# Patient Record
Sex: Male | Born: 1945 | Race: Black or African American | Hispanic: No | Marital: Married | Smoking: Former smoker
Health system: Southern US, Community
[De-identification: ages and names within clinical notes are randomized; demographics above are authoritative.]

## PROBLEM LIST (undated history)

## (undated) DIAGNOSIS — I10 Essential (primary) hypertension: Secondary | ICD-10-CM

## (undated) DIAGNOSIS — M21379 Foot drop, unspecified foot: Secondary | ICD-10-CM

## (undated) DIAGNOSIS — E559 Vitamin D deficiency, unspecified: Secondary | ICD-10-CM

## (undated) DIAGNOSIS — I69993 Ataxia following unspecified cerebrovascular disease: Secondary | ICD-10-CM

## (undated) DIAGNOSIS — I739 Peripheral vascular disease, unspecified: Secondary | ICD-10-CM

## (undated) DIAGNOSIS — M1710 Unilateral primary osteoarthritis, unspecified knee: Secondary | ICD-10-CM

## (undated) DIAGNOSIS — I2510 Atherosclerotic heart disease of native coronary artery without angina pectoris: Secondary | ICD-10-CM

## (undated) DIAGNOSIS — I639 Cerebral infarction, unspecified: Secondary | ICD-10-CM

## (undated) DIAGNOSIS — N400 Enlarged prostate without lower urinary tract symptoms: Principal | ICD-10-CM

## (undated) DIAGNOSIS — I69920 Aphasia following unspecified cerebrovascular disease: Secondary | ICD-10-CM

## (undated) HISTORY — DX: Benign prostatic hyperplasia without lower urinary tract symptoms: N40.0

## (undated) HISTORY — DX: Vitamin D deficiency, unspecified: E55.9

## (undated) HISTORY — DX: Aphasia following unspecified cerebrovascular disease: I69.920

## (undated) HISTORY — DX: Foot drop, unspecified foot: M21.379

## (undated) HISTORY — PX: CORONARY ARTERY BYPASS GRAFT: SHX141

## (undated) HISTORY — DX: Essential (primary) hypertension: I10

## (undated) HISTORY — DX: Ataxia following unspecified cerebrovascular disease: I69.993

## (undated) HISTORY — DX: Atherosclerotic heart disease of native coronary artery without angina pectoris: I25.10

## (undated) HISTORY — DX: Unilateral primary osteoarthritis, unspecified knee: M17.10

## (undated) HISTORY — DX: Cerebral infarction, unspecified: I63.9

## (undated) HISTORY — DX: Peripheral vascular disease, unspecified: I73.9

---

## 1997-10-24 ENCOUNTER — Ambulatory Visit (HOSPITAL_COMMUNITY): Admission: RE | Admit: 1997-10-24 | Discharge: 1997-10-24 | Payer: Self-pay | Admitting: Cardiology

## 1998-01-12 ENCOUNTER — Ambulatory Visit (HOSPITAL_COMMUNITY): Admission: RE | Admit: 1998-01-12 | Discharge: 1998-01-12 | Payer: Self-pay | Admitting: Cardiology

## 1998-01-19 ENCOUNTER — Ambulatory Visit (HOSPITAL_COMMUNITY): Admission: RE | Admit: 1998-01-19 | Discharge: 1998-01-19 | Payer: Self-pay | Admitting: Cardiology

## 1998-02-03 ENCOUNTER — Ambulatory Visit (HOSPITAL_COMMUNITY): Admission: RE | Admit: 1998-02-03 | Discharge: 1998-02-03 | Payer: Self-pay | Admitting: Cardiology

## 1998-09-29 ENCOUNTER — Ambulatory Visit (HOSPITAL_COMMUNITY): Admission: RE | Admit: 1998-09-29 | Discharge: 1998-09-29 | Payer: Self-pay | Admitting: Cardiology

## 1998-10-13 ENCOUNTER — Encounter: Payer: Self-pay | Admitting: Thoracic Surgery (Cardiothoracic Vascular Surgery)

## 1998-10-17 ENCOUNTER — Inpatient Hospital Stay (HOSPITAL_COMMUNITY)
Admission: RE | Admit: 1998-10-17 | Discharge: 1998-10-25 | Payer: Self-pay | Admitting: Thoracic Surgery (Cardiothoracic Vascular Surgery)

## 1998-10-17 ENCOUNTER — Encounter: Payer: Self-pay | Admitting: Thoracic Surgery (Cardiothoracic Vascular Surgery)

## 1998-10-18 ENCOUNTER — Encounter: Payer: Self-pay | Admitting: Thoracic Surgery (Cardiothoracic Vascular Surgery)

## 1998-10-19 ENCOUNTER — Encounter: Payer: Self-pay | Admitting: Thoracic Surgery (Cardiothoracic Vascular Surgery)

## 1998-10-20 ENCOUNTER — Encounter: Payer: Self-pay | Admitting: Thoracic Surgery (Cardiothoracic Vascular Surgery)

## 1998-10-24 ENCOUNTER — Encounter: Payer: Self-pay | Admitting: Surgery

## 1998-10-29 ENCOUNTER — Encounter: Payer: Self-pay | Admitting: Emergency Medicine

## 1998-10-30 ENCOUNTER — Inpatient Hospital Stay (HOSPITAL_COMMUNITY): Admission: EM | Admit: 1998-10-30 | Discharge: 1998-11-09 | Payer: Self-pay | Admitting: Emergency Medicine

## 1998-10-30 ENCOUNTER — Encounter: Payer: Self-pay | Admitting: Pulmonary Disease

## 1998-11-02 ENCOUNTER — Encounter: Payer: Self-pay | Admitting: Internal Medicine

## 1998-11-09 ENCOUNTER — Inpatient Hospital Stay (HOSPITAL_COMMUNITY)
Admission: RE | Admit: 1998-11-09 | Discharge: 1998-11-21 | Payer: Self-pay | Admitting: Physical Medicine & Rehabilitation

## 1998-11-23 ENCOUNTER — Encounter
Admission: RE | Admit: 1998-11-23 | Discharge: 1999-01-24 | Payer: Self-pay | Admitting: Physical Medicine & Rehabilitation

## 1999-03-27 ENCOUNTER — Encounter: Admission: RE | Admit: 1999-03-27 | Discharge: 1999-06-25 | Payer: Self-pay | Admitting: Cardiology

## 1999-04-30 ENCOUNTER — Encounter: Payer: Self-pay | Admitting: Internal Medicine

## 1999-04-30 ENCOUNTER — Inpatient Hospital Stay (HOSPITAL_COMMUNITY): Admission: AD | Admit: 1999-04-30 | Discharge: 1999-05-04 | Payer: Self-pay | Admitting: Internal Medicine

## 1999-05-01 ENCOUNTER — Encounter: Payer: Self-pay | Admitting: Internal Medicine

## 1999-05-01 ENCOUNTER — Encounter: Payer: Self-pay | Admitting: Neurology

## 1999-06-13 ENCOUNTER — Inpatient Hospital Stay (HOSPITAL_COMMUNITY): Admission: RE | Admit: 1999-06-13 | Discharge: 1999-06-15 | Payer: Self-pay | Admitting: Interventional Radiology

## 1999-07-19 ENCOUNTER — Ambulatory Visit (HOSPITAL_COMMUNITY): Admission: RE | Admit: 1999-07-19 | Discharge: 1999-07-19 | Payer: Self-pay | Admitting: Interventional Radiology

## 1999-08-15 ENCOUNTER — Ambulatory Visit (HOSPITAL_COMMUNITY): Admission: RE | Admit: 1999-08-15 | Discharge: 1999-08-15 | Payer: Self-pay | Admitting: Interventional Radiology

## 1999-10-23 ENCOUNTER — Encounter: Payer: Self-pay | Admitting: Internal Medicine

## 1999-10-23 ENCOUNTER — Inpatient Hospital Stay (HOSPITAL_COMMUNITY): Admission: EM | Admit: 1999-10-23 | Discharge: 1999-10-25 | Payer: Self-pay | Admitting: Emergency Medicine

## 1999-12-20 ENCOUNTER — Encounter: Payer: Self-pay | Admitting: Cardiology

## 1999-12-20 ENCOUNTER — Ambulatory Visit (HOSPITAL_COMMUNITY): Admission: RE | Admit: 1999-12-20 | Discharge: 1999-12-20 | Payer: Self-pay | Admitting: Cardiology

## 1999-12-26 ENCOUNTER — Encounter: Payer: Self-pay | Admitting: Emergency Medicine

## 1999-12-26 ENCOUNTER — Emergency Department (HOSPITAL_COMMUNITY): Admission: EM | Admit: 1999-12-26 | Discharge: 1999-12-26 | Payer: Self-pay | Admitting: Emergency Medicine

## 2000-01-22 ENCOUNTER — Encounter: Admission: RE | Admit: 2000-01-22 | Discharge: 2000-02-05 | Payer: Self-pay | Admitting: Family Medicine

## 2000-02-04 ENCOUNTER — Inpatient Hospital Stay (HOSPITAL_COMMUNITY): Admission: RE | Admit: 2000-02-04 | Discharge: 2000-02-05 | Payer: Self-pay | Admitting: Cardiology

## 2000-02-04 ENCOUNTER — Encounter: Payer: Self-pay | Admitting: Cardiology

## 2000-02-12 ENCOUNTER — Ambulatory Visit: Admission: RE | Admit: 2000-02-12 | Discharge: 2000-02-12 | Payer: Self-pay | Admitting: Cardiovascular Disease

## 2000-03-24 ENCOUNTER — Ambulatory Visit: Admission: RE | Admit: 2000-03-24 | Discharge: 2000-03-24 | Payer: Self-pay | Admitting: Internal Medicine

## 2000-04-03 ENCOUNTER — Encounter: Payer: Self-pay | Admitting: Internal Medicine

## 2000-04-03 ENCOUNTER — Ambulatory Visit (HOSPITAL_COMMUNITY): Admission: RE | Admit: 2000-04-03 | Discharge: 2000-04-03 | Payer: Self-pay | Admitting: Internal Medicine

## 2001-03-24 ENCOUNTER — Encounter: Payer: Self-pay | Admitting: Internal Medicine

## 2001-03-24 ENCOUNTER — Inpatient Hospital Stay (HOSPITAL_COMMUNITY): Admission: EM | Admit: 2001-03-24 | Discharge: 2001-03-31 | Payer: Self-pay | Admitting: Emergency Medicine

## 2001-03-25 ENCOUNTER — Encounter: Payer: Self-pay | Admitting: Internal Medicine

## 2001-03-26 ENCOUNTER — Encounter: Payer: Self-pay | Admitting: Internal Medicine

## 2001-04-13 ENCOUNTER — Encounter: Admission: RE | Admit: 2001-04-13 | Discharge: 2001-06-10 | Payer: Self-pay | Admitting: Internal Medicine

## 2001-12-17 ENCOUNTER — Ambulatory Visit (HOSPITAL_COMMUNITY): Admission: RE | Admit: 2001-12-17 | Discharge: 2001-12-17 | Payer: Self-pay | Admitting: Interventional Radiology

## 2002-11-26 ENCOUNTER — Emergency Department (HOSPITAL_COMMUNITY): Admission: EM | Admit: 2002-11-26 | Discharge: 2002-11-27 | Payer: Self-pay | Admitting: Emergency Medicine

## 2003-01-12 ENCOUNTER — Ambulatory Visit (HOSPITAL_COMMUNITY): Admission: RE | Admit: 2003-01-12 | Discharge: 2003-01-12 | Payer: Self-pay | Admitting: Interventional Radiology

## 2004-01-18 ENCOUNTER — Ambulatory Visit (HOSPITAL_COMMUNITY): Admission: RE | Admit: 2004-01-18 | Discharge: 2004-01-18 | Payer: Self-pay | Admitting: Interventional Radiology

## 2004-02-16 ENCOUNTER — Inpatient Hospital Stay (HOSPITAL_COMMUNITY): Admission: EM | Admit: 2004-02-16 | Discharge: 2004-02-23 | Payer: Self-pay | Admitting: Emergency Medicine

## 2004-04-10 ENCOUNTER — Ambulatory Visit (HOSPITAL_COMMUNITY): Admission: RE | Admit: 2004-04-10 | Discharge: 2004-04-10 | Payer: Self-pay | Admitting: Neurology

## 2004-05-09 ENCOUNTER — Observation Stay (HOSPITAL_COMMUNITY): Admission: RE | Admit: 2004-05-09 | Discharge: 2004-05-09 | Payer: Self-pay | Admitting: Interventional Radiology

## 2004-08-01 ENCOUNTER — Emergency Department (HOSPITAL_COMMUNITY): Admission: EM | Admit: 2004-08-01 | Discharge: 2004-08-01 | Payer: Self-pay | Admitting: Emergency Medicine

## 2004-11-12 ENCOUNTER — Ambulatory Visit (HOSPITAL_COMMUNITY): Admission: RE | Admit: 2004-11-12 | Discharge: 2004-11-12 | Payer: Self-pay | Admitting: Interventional Radiology

## 2004-12-02 IMAGING — CT CT HEAD W/O CM
1 of 2 series · 13 of 30 positions shown, 17 images · non-contrast
Comparison: none

CLINICAL DATA: Left-sided weakness. 
CT HEAD WITHOUT CONTRAST, 02/15/04
TECHNIQUE: Multidetector helical CT scanning obtained from the skull base to the vertex.

[Series 2: brain · axial · 0.47mm/px · z∈[+146,+274]mm · 13 of 32 slices shown, 17 images]
[im 3/32  brain]
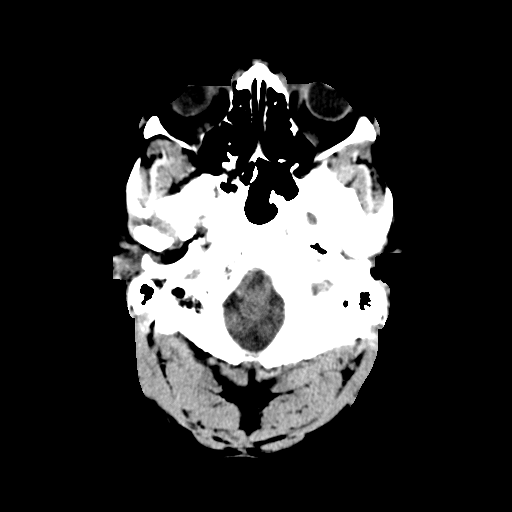
[im 3/32  bone]
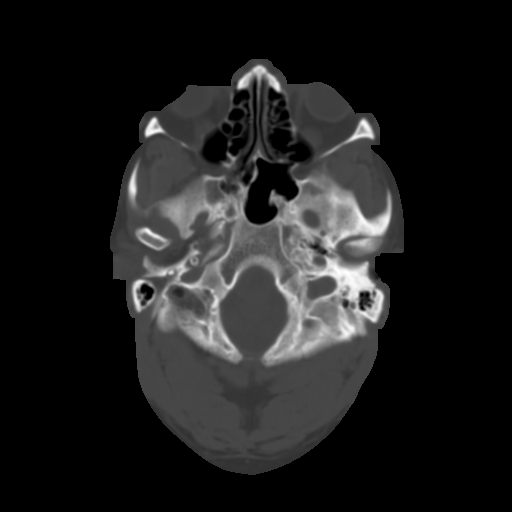
[im 5/32  brain]
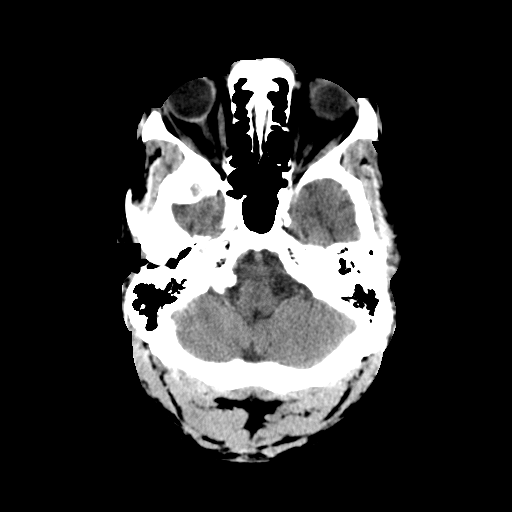
[im 7/32  brain]
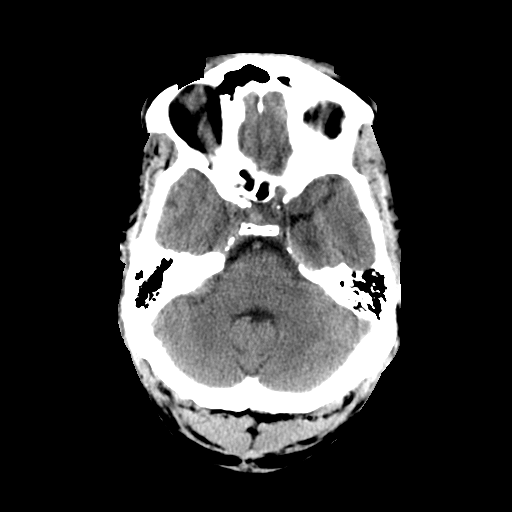
[im 9/32  brain]
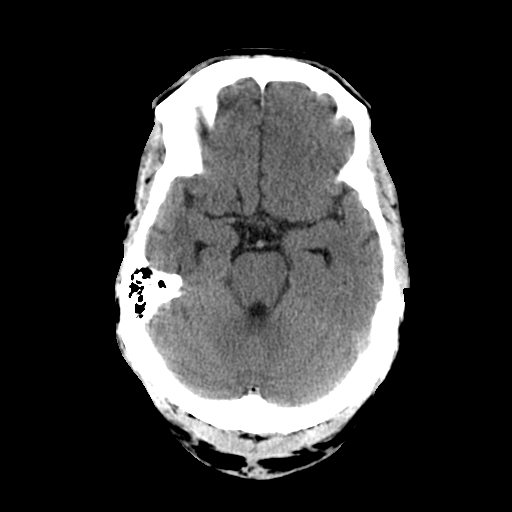
[im 12/32  brain]
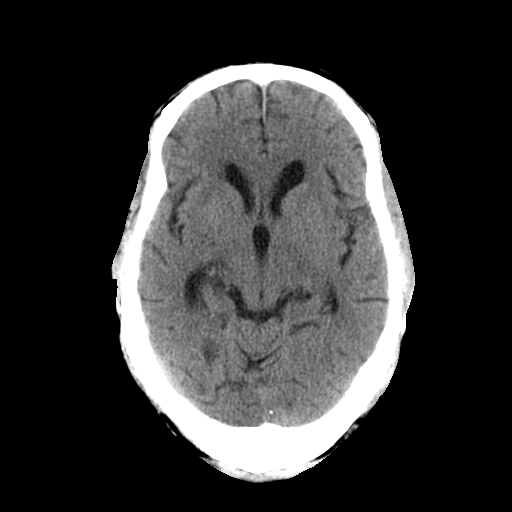
[im 12/32  bone]
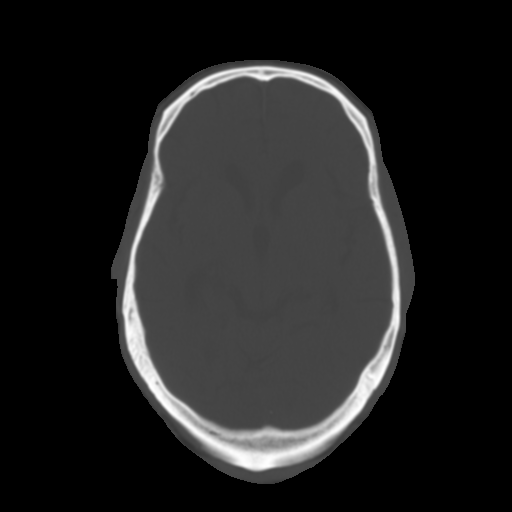
[im 14/32  brain]
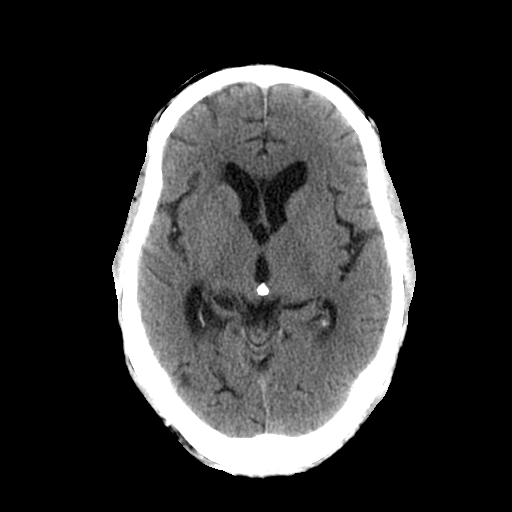
[im 16/32  brain]
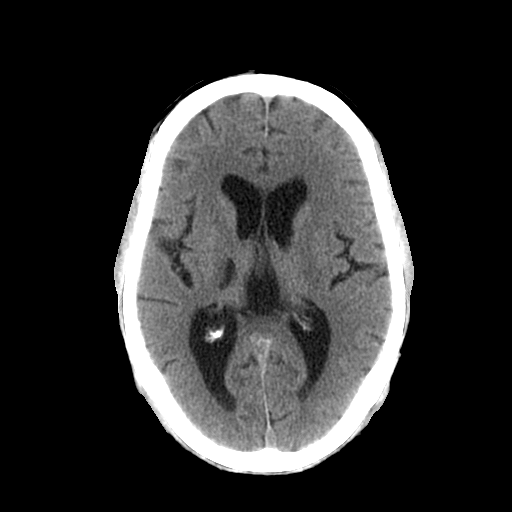
[im 18/32  brain]
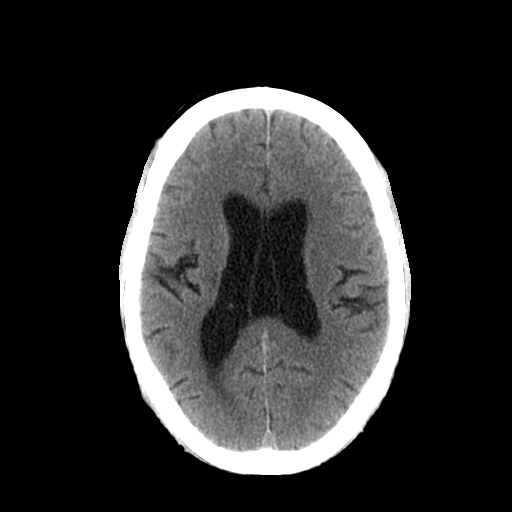
[im 20/32  brain]
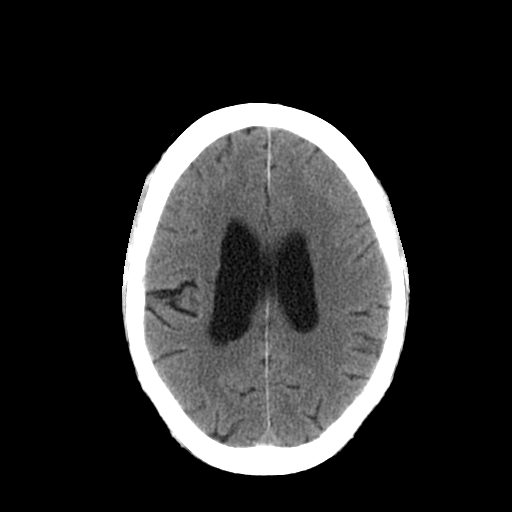
[im 20/32  bone]
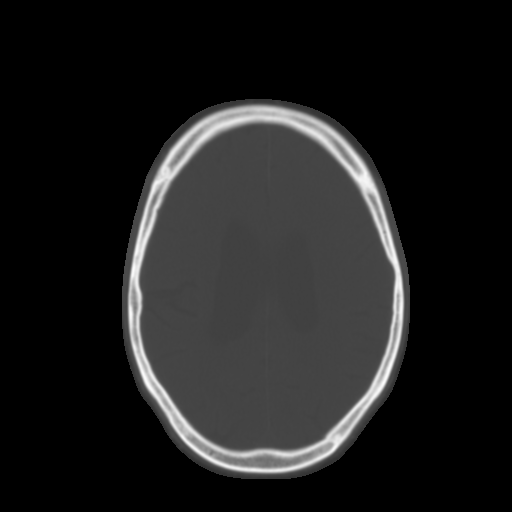
[im 23/32  brain]
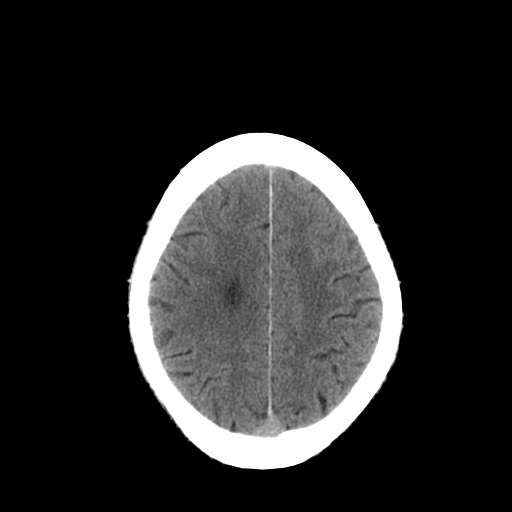
[im 25/32  brain]
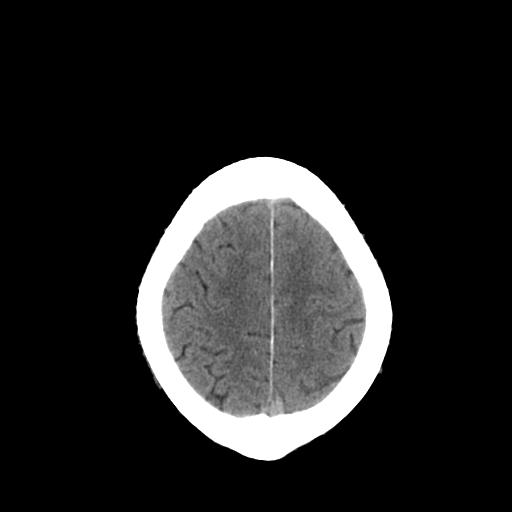
[im 27/32  brain]
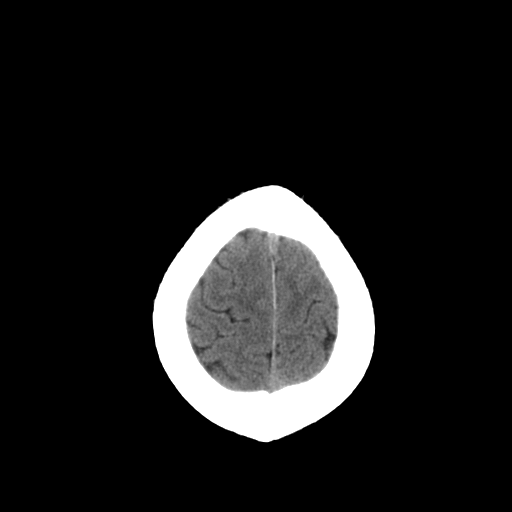
[im 29/32  brain]
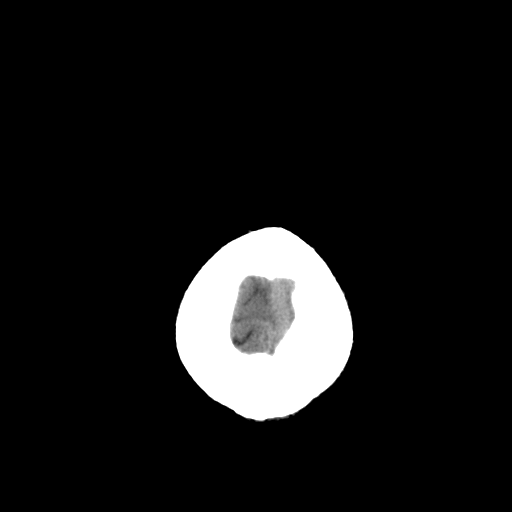
[im 29/32  bone]
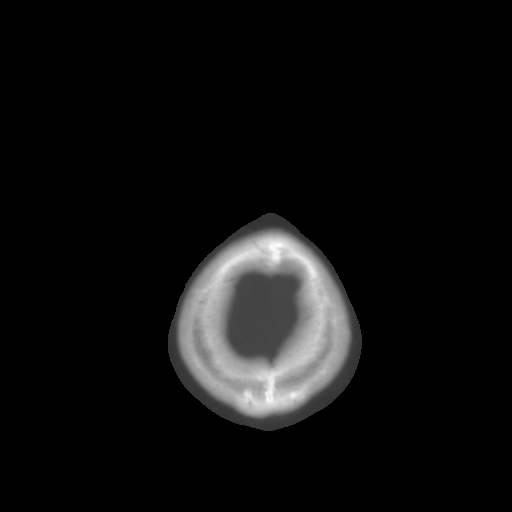

[13 of 30 positions shown; findings below may reference images not displayed]

FINDINGS: Comparison to MRI dated 01/18/04.
Remote infarcts in the thalami bilaterally are noted.  No evidence of acute intracranial abnormality including mass or mass effect, hydrocephalus, extra-axial fluid collection, midline shift, hemorrhage or acute infarct.  Acute infarct may be missed by CT for 24-48 hours.  Visualized bony calvarium and paranasal sinuses are unremarkable.  Cavum septum pellucidum et vergae is again identified.  
IMPRESSION
No evidence of acute intracranial abnormality. 
Remote thalamic infarcts.

## 2005-09-27 ENCOUNTER — Encounter (INDEPENDENT_AMBULATORY_CARE_PROVIDER_SITE_OTHER): Payer: Self-pay | Admitting: Specialist

## 2005-09-27 ENCOUNTER — Ambulatory Visit (HOSPITAL_COMMUNITY): Admission: RE | Admit: 2005-09-27 | Discharge: 2005-09-27 | Payer: Self-pay | Admitting: Urology

## 2005-11-04 ENCOUNTER — Inpatient Hospital Stay (HOSPITAL_COMMUNITY): Admission: EM | Admit: 2005-11-04 | Discharge: 2005-11-11 | Payer: Self-pay | Admitting: Emergency Medicine

## 2005-11-06 ENCOUNTER — Encounter (INDEPENDENT_AMBULATORY_CARE_PROVIDER_SITE_OTHER): Payer: Self-pay | Admitting: *Deleted

## 2006-05-30 ENCOUNTER — Emergency Department (HOSPITAL_COMMUNITY): Admission: EM | Admit: 2006-05-30 | Discharge: 2006-05-30 | Payer: Self-pay | Admitting: Emergency Medicine

## 2006-06-11 ENCOUNTER — Emergency Department (HOSPITAL_COMMUNITY): Admission: EM | Admit: 2006-06-11 | Discharge: 2006-06-11 | Payer: Self-pay | Admitting: Emergency Medicine

## 2006-12-20 ENCOUNTER — Emergency Department (HOSPITAL_COMMUNITY): Admission: EM | Admit: 2006-12-20 | Discharge: 2006-12-20 | Payer: Self-pay | Admitting: *Deleted

## 2007-01-15 ENCOUNTER — Encounter: Payer: Self-pay | Admitting: Interventional Radiology

## 2007-01-23 ENCOUNTER — Ambulatory Visit (HOSPITAL_COMMUNITY): Admission: RE | Admit: 2007-01-23 | Discharge: 2007-01-23 | Payer: Self-pay | Admitting: Interventional Radiology

## 2007-03-17 IMAGING — CR DG ANKLE COMPLETE 3+V*L*
3 series · 3 of 3 positions shown · non-contrast
Comparison: none

CLINICAL DATA: Twisted ankle.  Pain.
LEFT ANKLE ? 3 VIEW:

[view not recorded (1 of 3)]
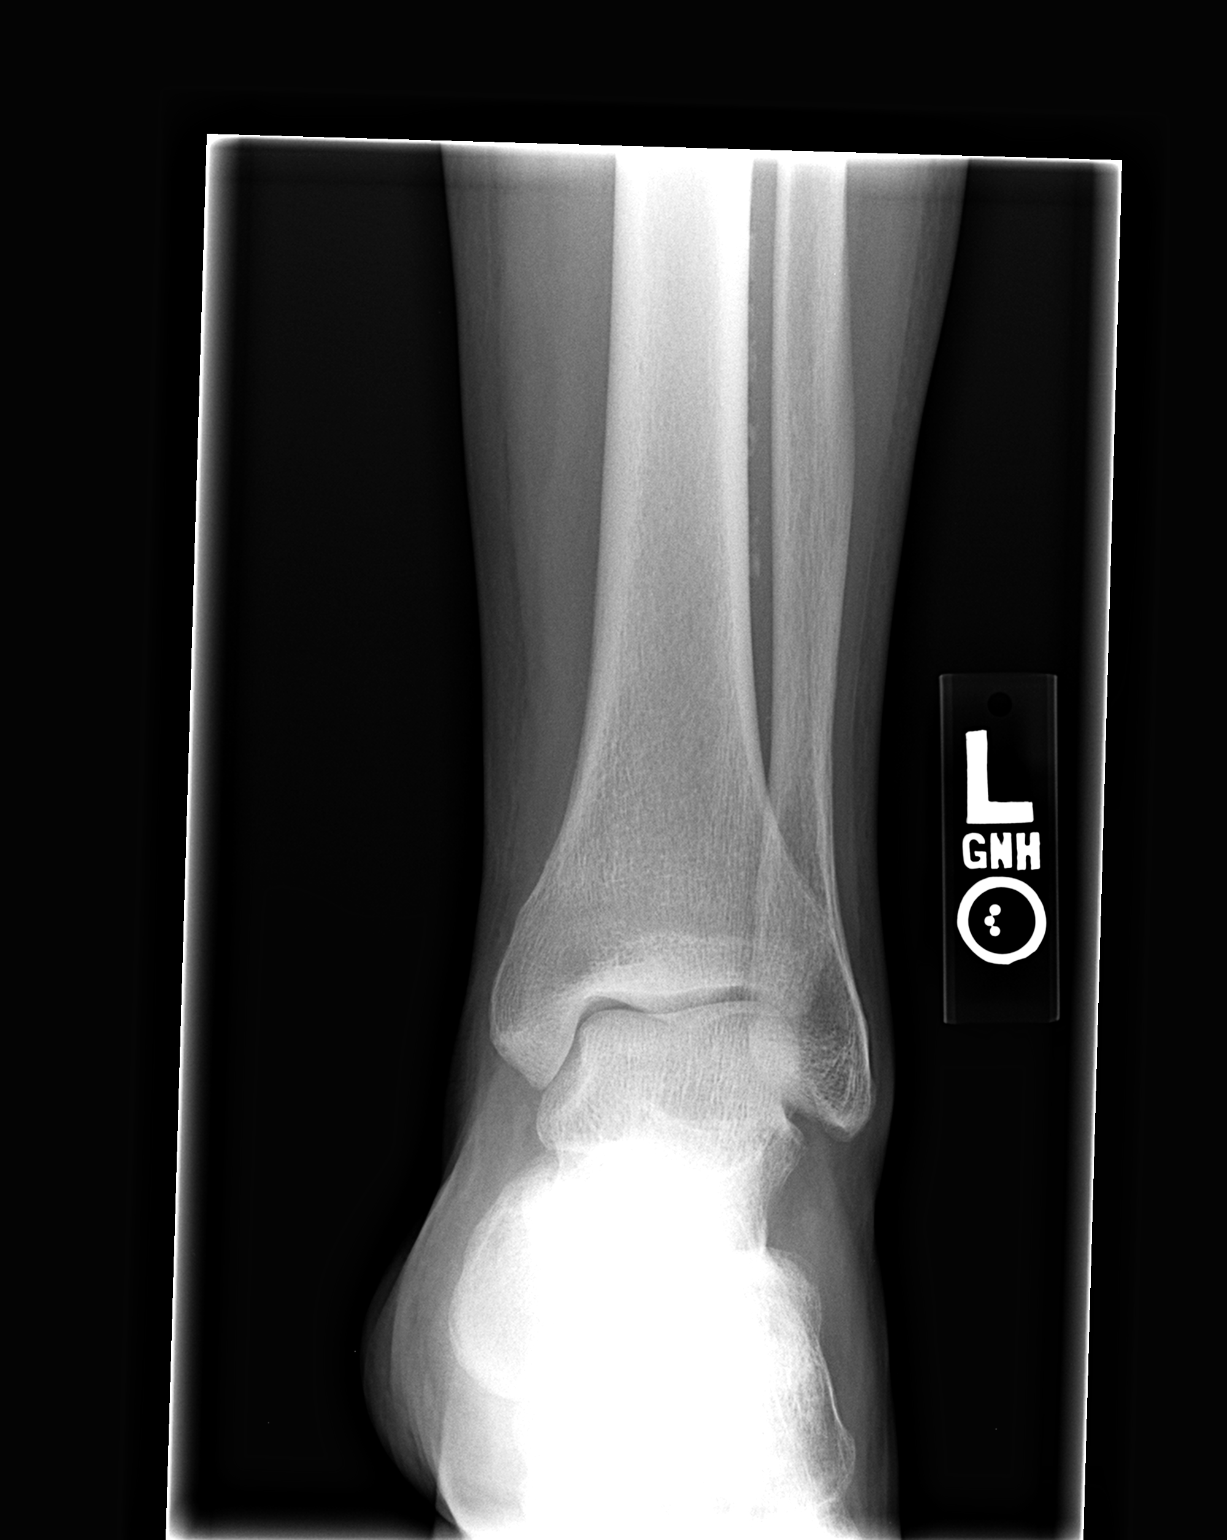

[view not recorded (2 of 3)]
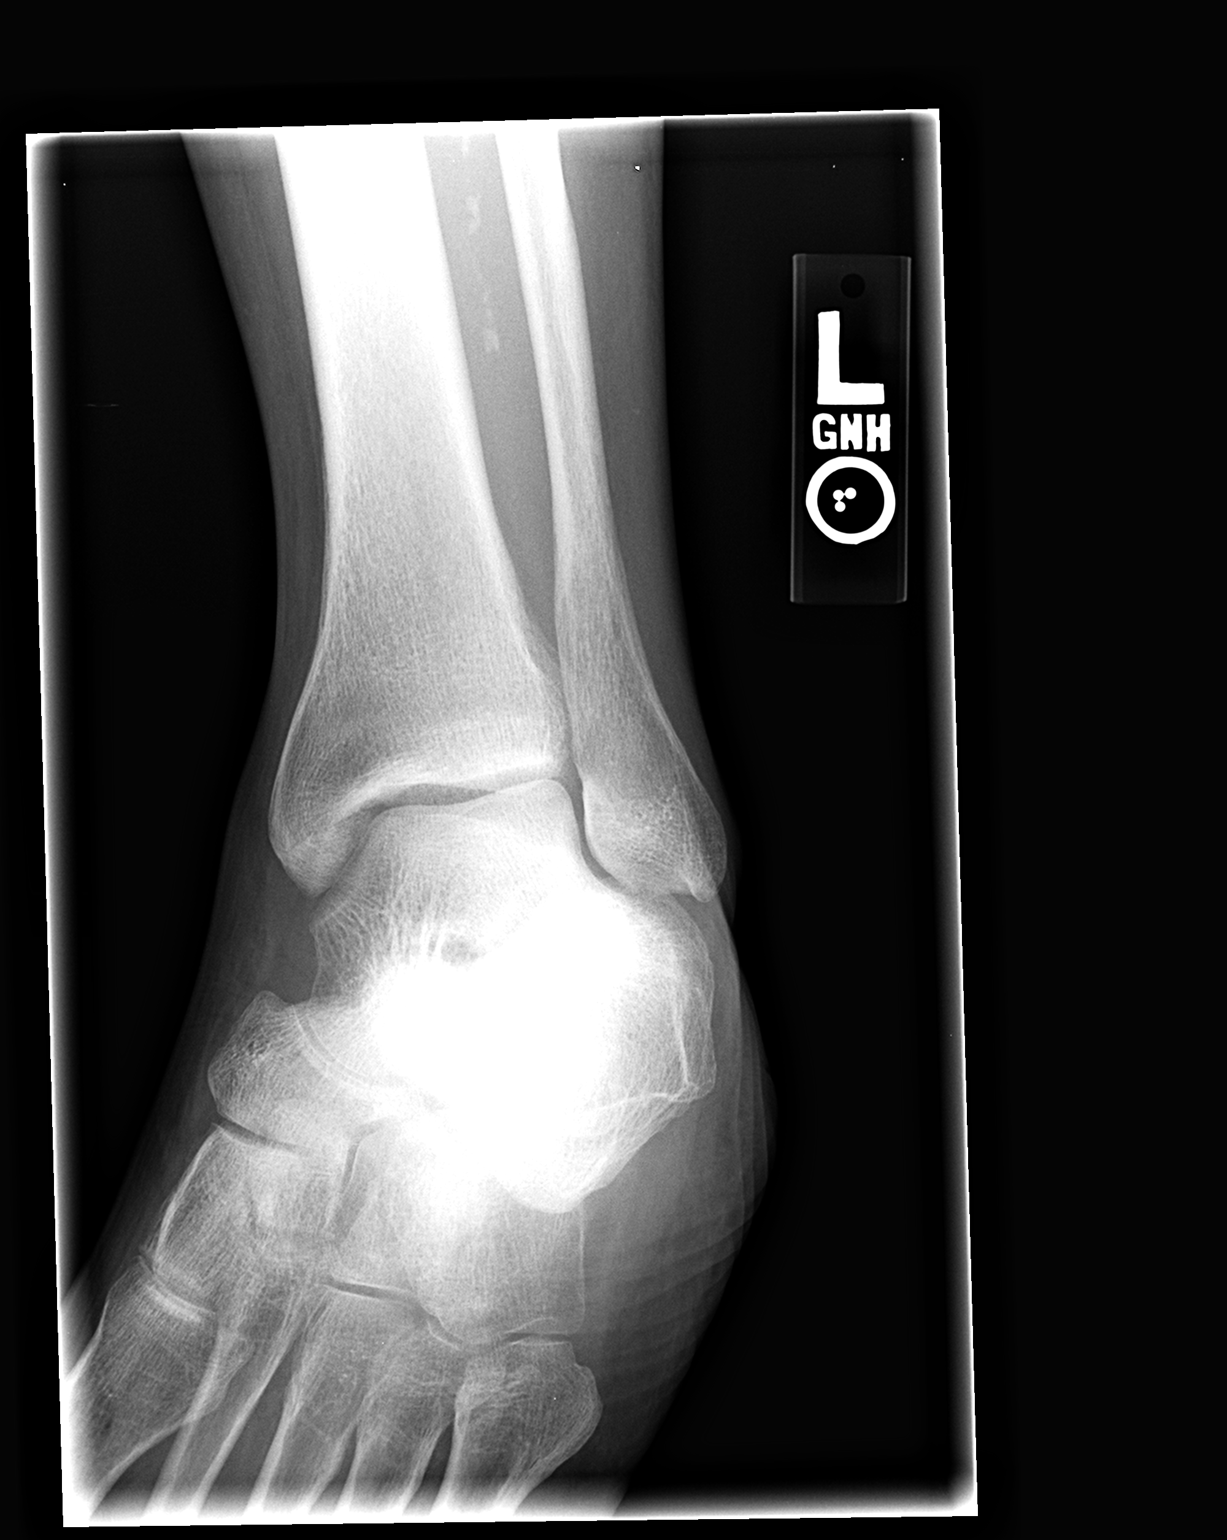

[view not recorded (3 of 3)]
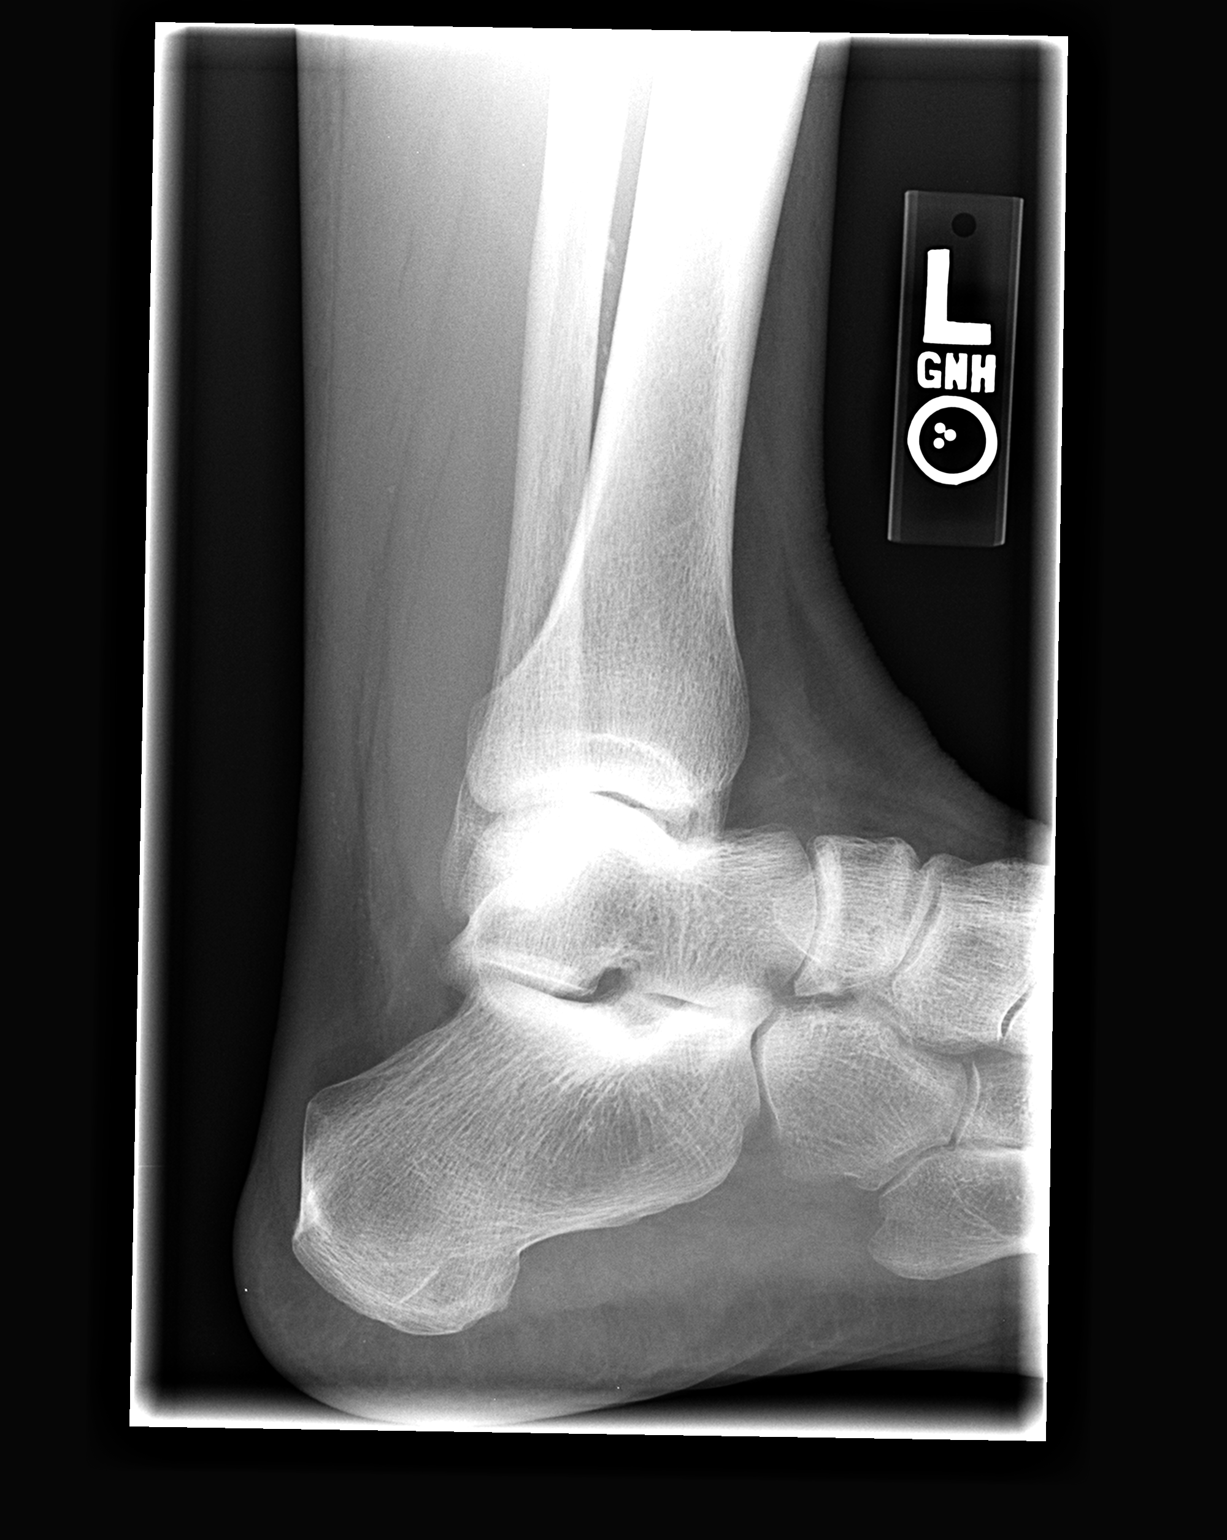

[3 of 3 positions shown; findings below may reference images not displayed]

FINDINGS: Marked soft tissue swelling posteriorly.  No fracture or subluxation.  Calcification of distal trifurcation artery.
IMPRESSION: No acute bony abnormality.

## 2007-05-12 ENCOUNTER — Ambulatory Visit (HOSPITAL_COMMUNITY): Admission: RE | Admit: 2007-05-12 | Discharge: 2007-05-12 | Payer: Self-pay | Admitting: *Deleted

## 2007-05-28 ENCOUNTER — Encounter: Admission: RE | Admit: 2007-05-28 | Discharge: 2007-07-30 | Payer: Self-pay | Admitting: Internal Medicine

## 2007-07-13 ENCOUNTER — Ambulatory Visit (HOSPITAL_COMMUNITY): Admission: RE | Admit: 2007-07-13 | Discharge: 2007-07-13 | Payer: Self-pay | Admitting: Interventional Radiology

## 2007-11-10 IMAGING — XA IR ANGIO/CAROTID/CERV/UNI*R*
1 series · 16 of 24 positions shown · IV contrast (omnipaque)
Comparison: none

CLINICAL DATA: Patient with severe extracranial and intracranial cerebrovascular disease, status post stenting of right ICA at the petrous-cavernous junction.  Patient has new symptoms of left-sided weakness on warfarin.
BILATERAL VERTEBRAL AND RIGHT COMMON CAROTID ARTERIOGRAMS:
Procedure:  Following the full explanation of the procedure, along with the potentially associated complications, an informed witnessed consent was obtained. 
The left groin was prepped and draped in the usual sterile fashion.  Thereafter, using a modified Seldinger technique, transfemoral access into the left common femoral artery was obtained through the graft without difficulty.  Over a 0.035-inch guidewire, a 4-French Pinnacle sheath was inserted.  Through this and also over a 0.035-inch guidewire, a 4-French JB-1 catheter was advanced through the aortic arch region and selectively positioned in the right common carotid artery, in the right vertebral artery, and the left vertebral artery.  There were no acute complications.  The patient tolerated the procedure well. 
Medications utilized:  Versed 1 mg IV, fentanyl 25 mcg IV. 
Contrast:  Omnipaque 300, approximately 50 cc.

[Series 1: run · 16 of 63 slices shown]
[im 1/63]
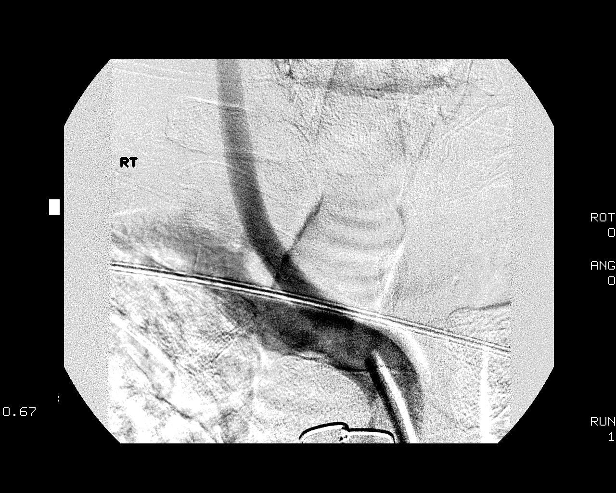
[im 6/63]
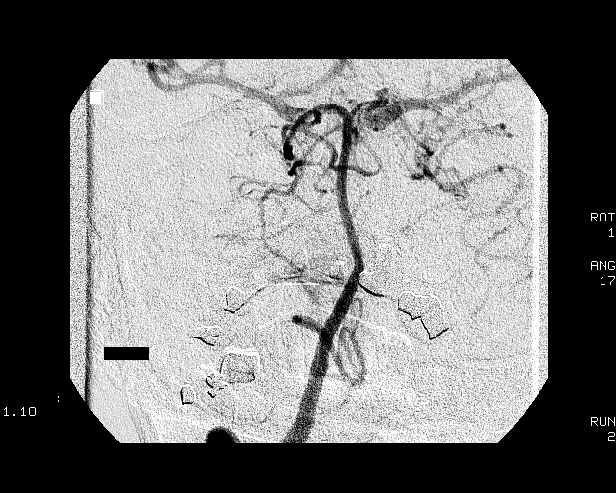
[im 9/63]
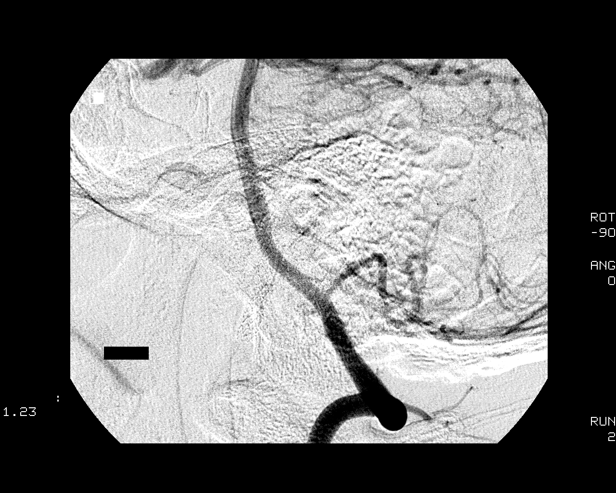
[im 14/63]
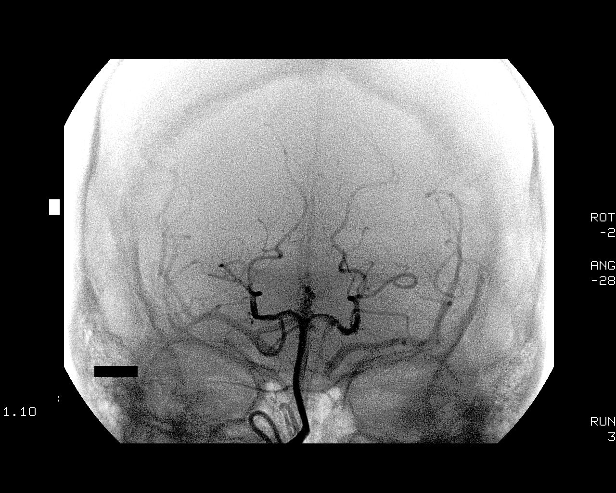
[im 17/63]
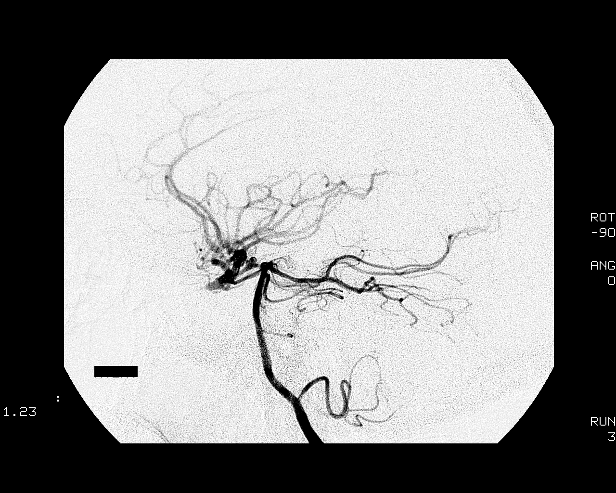
[im 22/63]
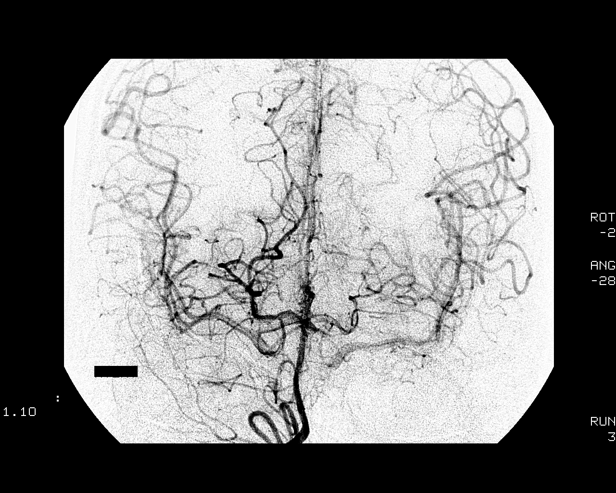
[im 25/63]
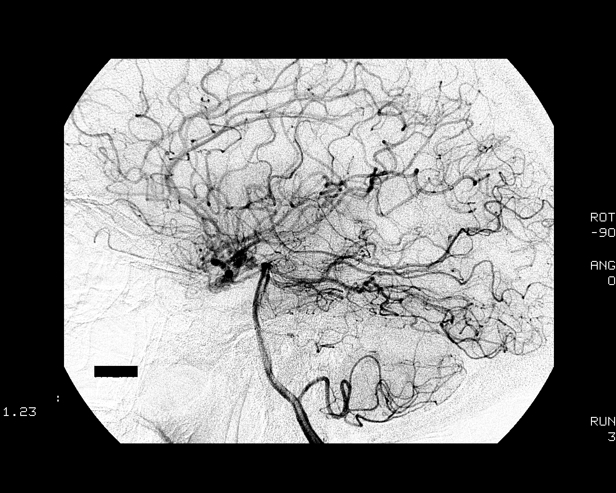
[im 30/63]
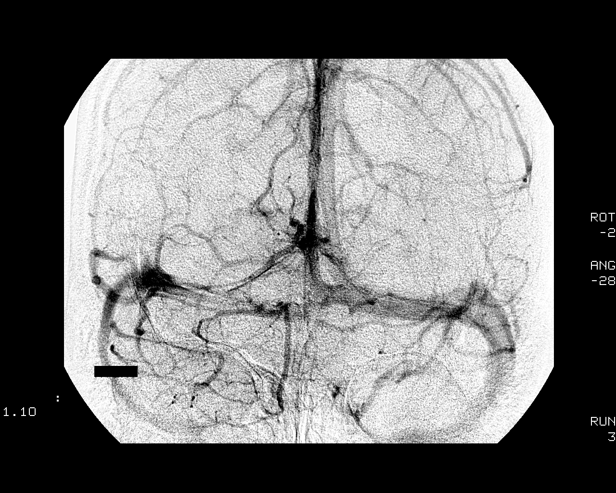
[im 33/63]
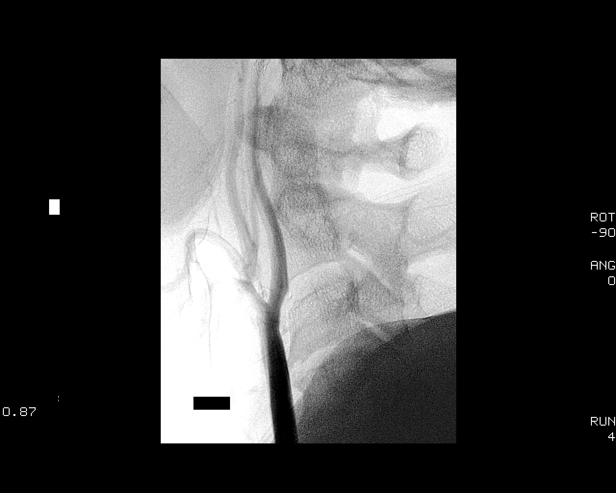
[im 38/63]
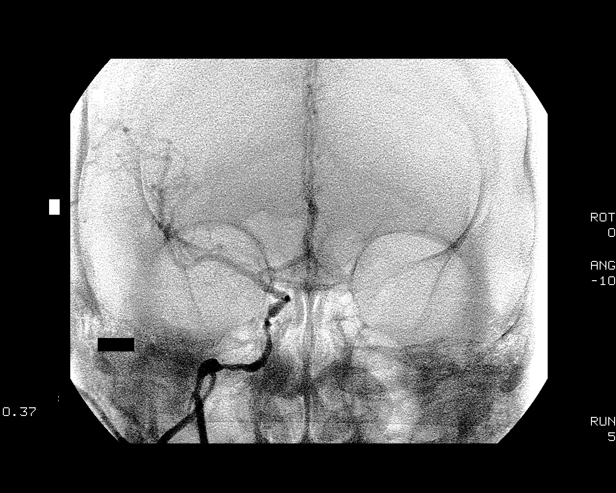
[im 41/63]
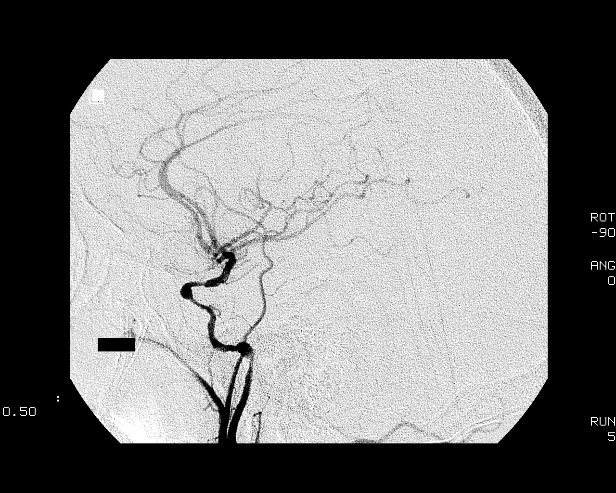
[im 46/63]
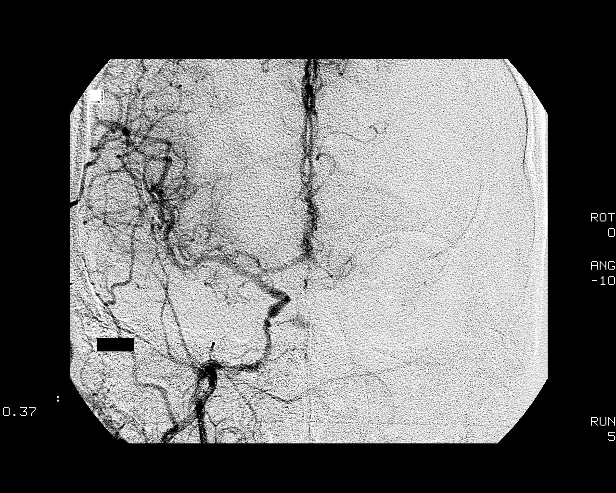
[im 49/63]
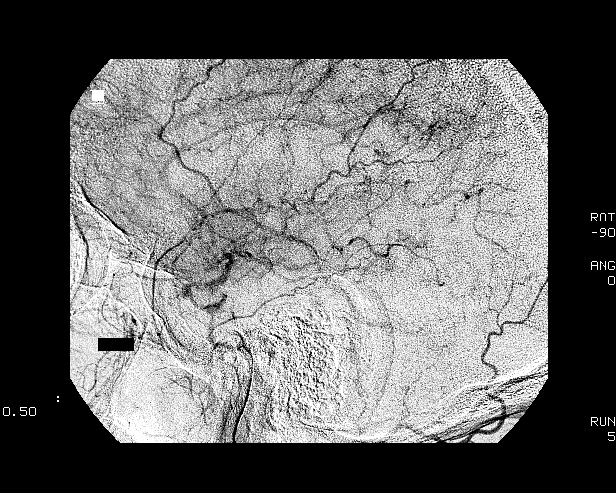
[im 54/63]
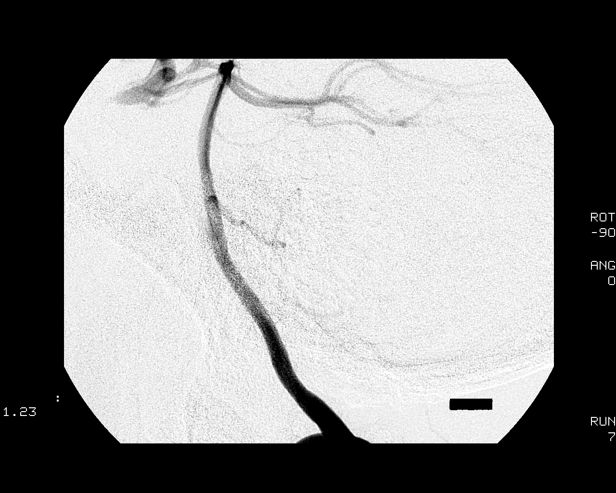
[im 57/63]
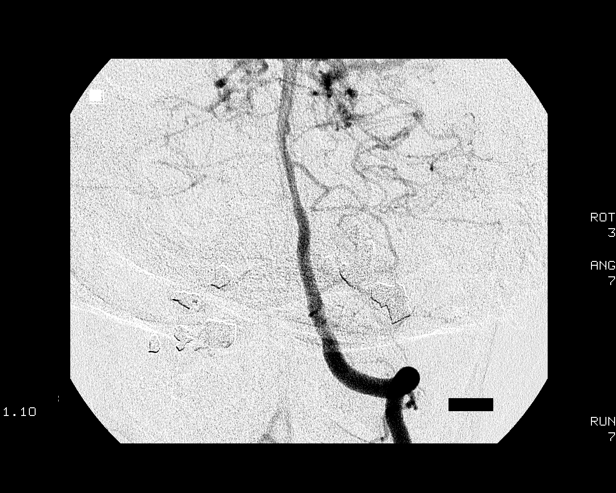
[im 63/63]
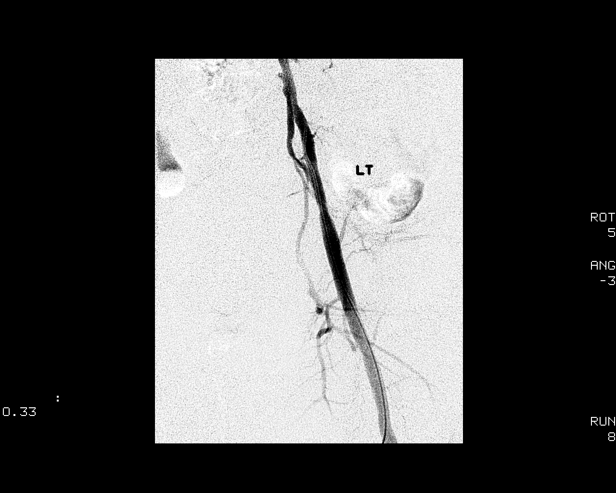

[16 of 24 positions shown; findings below may reference images not displayed]

FINDINGS: The right vertebral artery origin is normal.  The vessel is seen to opacify normally to the cranial skull base.  Normal opacification is seen of the right posterior inferior cerebellar artery and the basilar artery.  The basilar artery, posterior cerebellar artery, superior cerebellar arteries, and the anterior inferior cerebellar arteries are seen to opacify normally into capillary and venous phases.  Also noted is almost simultaneous retrograde opacification via the PCOMs bilaterally to opacify the bilateral PCA distributions and also the anterior cerebral artery distributions bilaterally.  The subsequent capillary and venous phases are normal supratentorially and intratentorially.  
The right common carotid arteriogram demonstrates the right external carotid artery and its major branches to be normal.  The right internal carotid artery has a smooth shallow plaque along the posterior wall with insignificant NASCET-based stenosis.  The vessel is otherwise seen to opacify with decreased caliber in the petrous-cervical junction at the site of the positioning of the stent.  There is a 50% stenosis just distal to the stent.  Distal to this, there is a 70% stenosis of the proximal cavernous segment and distal cavernous segment of the right ICA.  
The right supraclinoid ICA, the right middle, and the right anterior cerebral arteries are seen to opacify normally into capillary and venous phases.  There is cross-opacification via the ACOM at the left ACA distal to the A2 on the left side.  
The left vertebral artery origin is normal.  The vessel is seen to opacify normally to the cranial skull base.  Normal opacification is seen of the left vertebrobasilar junction and the left posterior inferior cerebral artery.  The basilar artery, posterior cerebral arteries, superior cerebellar arteries, and the anterior inferior cerebellar arteries are seen to opacify normally into capillary and venous phases.
Unopacified blood is seen in the basilar artery from the contralateral vertebral artery.  Again demonstrated is the almost simultaneous retrograde opacification via the PCOMs of both the MCA and ACA distributions.
IMPRESSION: 1.  Appearance 50% stenosis in the petrous segment of the right ICA distal the stent.
2.  Tandem stenosis of about 70% involving the proximal cavernous and the distal cavernous right ICA.
3.  Retrograde opacification of both anterior cerebral arteries and middle cerebral arteries via the PCOMs via both vertebral artery injections.
The above results were reviewed with the patient and the patient?s family.  The patient has been asked to continue taking his Coumadin and add 1 baby aspirin if tolerated.  Should the patient?s symptoms of left-sided numbness or weakness become more frequent or prolonged clinically, endovascular stenting of the cavernous segment of the right ICA would then probably be undertaken.

## 2008-01-01 ENCOUNTER — Ambulatory Visit (HOSPITAL_COMMUNITY): Admission: RE | Admit: 2008-01-01 | Discharge: 2008-01-02 | Payer: Self-pay | Admitting: Cardiology

## 2010-05-04 ENCOUNTER — Ambulatory Visit: Payer: Self-pay | Admitting: Vascular Surgery

## 2010-05-04 ENCOUNTER — Inpatient Hospital Stay (HOSPITAL_COMMUNITY): Admission: EM | Admit: 2010-05-04 | Discharge: 2010-05-08 | Payer: Self-pay | Admitting: Emergency Medicine

## 2010-05-04 ENCOUNTER — Encounter (INDEPENDENT_AMBULATORY_CARE_PROVIDER_SITE_OTHER): Payer: Self-pay | Admitting: Internal Medicine

## 2010-05-06 ENCOUNTER — Encounter (INDEPENDENT_AMBULATORY_CARE_PROVIDER_SITE_OTHER): Payer: Self-pay | Admitting: Internal Medicine

## 2010-05-07 ENCOUNTER — Ambulatory Visit: Payer: Self-pay | Admitting: Physical Medicine & Rehabilitation

## 2010-05-08 ENCOUNTER — Inpatient Hospital Stay (HOSPITAL_COMMUNITY)
Admission: RE | Admit: 2010-05-08 | Discharge: 2010-05-22 | Payer: Self-pay | Admitting: Physical Medicine & Rehabilitation

## 2010-05-09 ENCOUNTER — Ambulatory Visit: Payer: Self-pay | Admitting: Physical Medicine & Rehabilitation

## 2010-05-25 ENCOUNTER — Encounter
Admission: RE | Admit: 2010-05-25 | Discharge: 2010-07-05 | Payer: Self-pay | Source: Home / Self Care | Attending: Physical Medicine & Rehabilitation | Admitting: Physical Medicine & Rehabilitation

## 2010-06-27 ENCOUNTER — Encounter
Admission: RE | Admit: 2010-06-27 | Payer: Self-pay | Source: Home / Self Care | Admitting: Physical Medicine & Rehabilitation

## 2010-07-09 ENCOUNTER — Encounter
Admission: RE | Admit: 2010-07-09 | Discharge: 2010-08-07 | Payer: Self-pay | Source: Home / Self Care | Attending: Physical Medicine & Rehabilitation | Admitting: Physical Medicine & Rehabilitation

## 2010-07-09 ENCOUNTER — Ambulatory Visit: Payer: Self-pay | Admitting: Physical Medicine & Rehabilitation

## 2010-07-10 ENCOUNTER — Encounter
Admission: RE | Admit: 2010-07-10 | Discharge: 2010-08-07 | Payer: Self-pay | Source: Home / Self Care | Attending: Physical Medicine & Rehabilitation | Admitting: Physical Medicine & Rehabilitation

## 2010-07-11 ENCOUNTER — Encounter
Admission: RE | Admit: 2010-07-11 | Discharge: 2010-08-07 | Payer: Self-pay | Source: Home / Self Care | Attending: Physical Medicine & Rehabilitation | Admitting: Physical Medicine & Rehabilitation

## 2010-07-12 ENCOUNTER — Ambulatory Visit
Admission: RE | Admit: 2010-07-12 | Discharge: 2010-07-12 | Payer: Self-pay | Source: Home / Self Care | Attending: Physical Medicine & Rehabilitation | Admitting: Physical Medicine & Rehabilitation

## 2010-07-28 ENCOUNTER — Encounter: Payer: Self-pay | Admitting: Neurology

## 2010-08-08 ENCOUNTER — Ambulatory Visit
Payer: Medicare Other | Attending: Physical Medicine & Rehabilitation | Admitting: Rehabilitative and Restorative Service Providers"

## 2010-08-08 DIAGNOSIS — M6281 Muscle weakness (generalized): Secondary | ICD-10-CM | POA: Insufficient documentation

## 2010-08-08 DIAGNOSIS — IMO0001 Reserved for inherently not codable concepts without codable children: Secondary | ICD-10-CM | POA: Insufficient documentation

## 2010-08-08 DIAGNOSIS — R269 Unspecified abnormalities of gait and mobility: Secondary | ICD-10-CM | POA: Insufficient documentation

## 2010-08-08 DIAGNOSIS — I69998 Other sequelae following unspecified cerebrovascular disease: Secondary | ICD-10-CM | POA: Insufficient documentation

## 2010-08-13 ENCOUNTER — Ambulatory Visit: Payer: Medicare Other | Admitting: Rehabilitative and Restorative Service Providers"

## 2010-08-14 ENCOUNTER — Ambulatory Visit: Payer: Medicare Other | Admitting: Physical Medicine & Rehabilitation

## 2010-08-14 ENCOUNTER — Ambulatory Visit: Payer: Medicare Other

## 2010-08-14 ENCOUNTER — Ambulatory Visit: Payer: Medicare Other | Attending: Physical Medicine & Rehabilitation

## 2010-08-14 DIAGNOSIS — I69993 Ataxia following unspecified cerebrovascular disease: Secondary | ICD-10-CM

## 2010-08-14 DIAGNOSIS — I69959 Hemiplegia and hemiparesis following unspecified cerebrovascular disease affecting unspecified side: Secondary | ICD-10-CM | POA: Insufficient documentation

## 2010-08-14 DIAGNOSIS — Z7901 Long term (current) use of anticoagulants: Secondary | ICD-10-CM | POA: Insufficient documentation

## 2010-08-15 ENCOUNTER — Ambulatory Visit: Payer: Medicare Other | Admitting: Rehabilitative and Restorative Service Providers"

## 2010-09-19 LAB — GLUCOSE, CAPILLARY
Glucose-Capillary: 106 mg/dL — ABNORMAL HIGH (ref 70–99)
Glucose-Capillary: 110 mg/dL — ABNORMAL HIGH (ref 70–99)
Glucose-Capillary: 113 mg/dL — ABNORMAL HIGH (ref 70–99)
Glucose-Capillary: 123 mg/dL — ABNORMAL HIGH (ref 70–99)
Glucose-Capillary: 131 mg/dL — ABNORMAL HIGH (ref 70–99)
Glucose-Capillary: 139 mg/dL — ABNORMAL HIGH (ref 70–99)
Glucose-Capillary: 151 mg/dL — ABNORMAL HIGH (ref 70–99)
Glucose-Capillary: 153 mg/dL — ABNORMAL HIGH (ref 70–99)
Glucose-Capillary: 153 mg/dL — ABNORMAL HIGH (ref 70–99)
Glucose-Capillary: 158 mg/dL — ABNORMAL HIGH (ref 70–99)
Glucose-Capillary: 160 mg/dL — ABNORMAL HIGH (ref 70–99)
Glucose-Capillary: 161 mg/dL — ABNORMAL HIGH (ref 70–99)
Glucose-Capillary: 165 mg/dL — ABNORMAL HIGH (ref 70–99)
Glucose-Capillary: 166 mg/dL — ABNORMAL HIGH (ref 70–99)
Glucose-Capillary: 168 mg/dL — ABNORMAL HIGH (ref 70–99)
Glucose-Capillary: 170 mg/dL — ABNORMAL HIGH (ref 70–99)
Glucose-Capillary: 180 mg/dL — ABNORMAL HIGH (ref 70–99)
Glucose-Capillary: 185 mg/dL — ABNORMAL HIGH (ref 70–99)
Glucose-Capillary: 193 mg/dL — ABNORMAL HIGH (ref 70–99)
Glucose-Capillary: 195 mg/dL — ABNORMAL HIGH (ref 70–99)
Glucose-Capillary: 195 mg/dL — ABNORMAL HIGH (ref 70–99)
Glucose-Capillary: 203 mg/dL — ABNORMAL HIGH (ref 70–99)
Glucose-Capillary: 207 mg/dL — ABNORMAL HIGH (ref 70–99)
Glucose-Capillary: 213 mg/dL — ABNORMAL HIGH (ref 70–99)
Glucose-Capillary: 229 mg/dL — ABNORMAL HIGH (ref 70–99)
Glucose-Capillary: 256 mg/dL — ABNORMAL HIGH (ref 70–99)
Glucose-Capillary: 266 mg/dL — ABNORMAL HIGH (ref 70–99)
Glucose-Capillary: 275 mg/dL — ABNORMAL HIGH (ref 70–99)
Glucose-Capillary: 293 mg/dL — ABNORMAL HIGH (ref 70–99)
Glucose-Capillary: 296 mg/dL — ABNORMAL HIGH (ref 70–99)
Glucose-Capillary: 309 mg/dL — ABNORMAL HIGH (ref 70–99)
Glucose-Capillary: 332 mg/dL — ABNORMAL HIGH (ref 70–99)
Glucose-Capillary: 426 mg/dL — ABNORMAL HIGH (ref 70–99)
Glucose-Capillary: 69 mg/dL — ABNORMAL LOW (ref 70–99)
Glucose-Capillary: 86 mg/dL (ref 70–99)
Glucose-Capillary: 99 mg/dL (ref 70–99)

## 2010-09-19 LAB — PROTIME-INR
INR: 2.11 — ABNORMAL HIGH (ref 0.00–1.49)
INR: 2.19 — ABNORMAL HIGH (ref 0.00–1.49)
INR: 2.45 — ABNORMAL HIGH (ref 0.00–1.49)
INR: 2.8 — ABNORMAL HIGH (ref 0.00–1.49)
INR: 3.15 — ABNORMAL HIGH (ref 0.00–1.49)
INR: 3.16 — ABNORMAL HIGH (ref 0.00–1.49)
INR: 3.51 — ABNORMAL HIGH (ref 0.00–1.49)
Prothrombin Time: 23.1 seconds — ABNORMAL HIGH (ref 11.6–15.2)
Prothrombin Time: 23.8 seconds — ABNORMAL HIGH (ref 11.6–15.2)
Prothrombin Time: 24.5 seconds — ABNORMAL HIGH (ref 11.6–15.2)
Prothrombin Time: 26.1 seconds — ABNORMAL HIGH (ref 11.6–15.2)
Prothrombin Time: 28.9 seconds — ABNORMAL HIGH (ref 11.6–15.2)
Prothrombin Time: 29 seconds — ABNORMAL HIGH (ref 11.6–15.2)
Prothrombin Time: 29.6 seconds — ABNORMAL HIGH (ref 11.6–15.2)
Prothrombin Time: 32.4 seconds — ABNORMAL HIGH (ref 11.6–15.2)
Prothrombin Time: 32.5 seconds — ABNORMAL HIGH (ref 11.6–15.2)
Prothrombin Time: 34.2 seconds — ABNORMAL HIGH (ref 11.6–15.2)
Prothrombin Time: 35.2 seconds — ABNORMAL HIGH (ref 11.6–15.2)

## 2010-09-19 LAB — COMPREHENSIVE METABOLIC PANEL
ALT: 17 U/L (ref 0–53)
ALT: 24 U/L (ref 0–53)
AST: 20 U/L (ref 0–37)
Alkaline Phosphatase: 89 U/L (ref 39–117)
BUN: 14 mg/dL (ref 6–23)
CO2: 23 mEq/L (ref 19–32)
CO2: 26 mEq/L (ref 19–32)
CO2: 27 mEq/L (ref 19–32)
Calcium: 9.2 mg/dL (ref 8.4–10.5)
Chloride: 106 mEq/L (ref 96–112)
Chloride: 108 mEq/L (ref 96–112)
Creatinine, Ser: 1.35 mg/dL (ref 0.4–1.5)
GFR calc non Af Amer: 53 mL/min — ABNORMAL LOW (ref >60)
GFR calc non Af Amer: >60 mL/min (ref >60)
GFR calc non Af Amer: >60 mL/min (ref >60)
Glucose, Bld: 155 mg/dL — ABNORMAL HIGH (ref 70–99)
Glucose, Bld: 194 mg/dL — ABNORMAL HIGH (ref 70–99)
Potassium: 4.2 mEq/L (ref 3.5–5.1)
Sodium: 141 mEq/L (ref 135–145)
Total Bilirubin: 0.5 mg/dL (ref 0.3–1.2)
Total Bilirubin: 0.5 mg/dL (ref 0.3–1.2)

## 2010-09-19 LAB — CBC
HCT: 36.9 % — ABNORMAL LOW (ref 39.0–52.0)
HCT: 37.1 % — ABNORMAL LOW (ref 39.0–52.0)
HCT: 38.2 % — ABNORMAL LOW (ref 39.0–52.0)
Hemoglobin: 12 g/dL — ABNORMAL LOW (ref 13.0–17.0)
Hemoglobin: 13 g/dL (ref 13.0–17.0)
Hemoglobin: 13.2 g/dL (ref 13.0–17.0)
MCH: 32.4 pg (ref 26.0–34.0)
MCHC: 35 g/dL (ref 30.0–36.0)
MCV: 92.5 fL (ref 78.0–100.0)
MCV: 92.5 fL (ref 78.0–100.0)
Platelets: 232 10*3/uL (ref 150–400)
RBC: 3.91 MIL/uL — ABNORMAL LOW (ref 4.22–5.81)
RBC: 4.13 MIL/uL — ABNORMAL LOW (ref 4.22–5.81)
RBC: 4.22 MIL/uL (ref 4.22–5.81)
RDW: 13.7 % (ref 11.5–15.5)
RDW: 14 % (ref 11.5–15.5)
WBC: 5.7 10*3/uL (ref 4.0–10.5)
WBC: 6.7 10*3/uL (ref 4.0–10.5)
WBC: 6.9 10*3/uL (ref 4.0–10.5)

## 2010-09-19 LAB — LIPID PANEL
Cholesterol: 138 mg/dL (ref 0–200)
LDL Cholesterol: 76 mg/dL (ref 0–99)
Triglycerides: 91 mg/dL (ref <150)
VLDL: 18 mg/dL (ref 0–40)

## 2010-09-19 LAB — BASIC METABOLIC PANEL
BUN: 13 mg/dL (ref 6–23)
CO2: 25 mEq/L (ref 19–32)
CO2: 26 mEq/L (ref 19–32)
Calcium: 8.6 mg/dL (ref 8.4–10.5)
Calcium: 9.1 mg/dL (ref 8.4–10.5)
Chloride: 109 mEq/L (ref 96–112)
Creatinine, Ser: 0.91 mg/dL (ref 0.4–1.5)
Creatinine, Ser: 1.04 mg/dL (ref 0.4–1.5)
GFR calc Af Amer: >60 mL/min (ref >60)
Glucose, Bld: 170 mg/dL — ABNORMAL HIGH (ref 70–99)

## 2010-09-19 LAB — DIFFERENTIAL
Basophils Absolute: 0 10*3/uL (ref 0.0–0.1)
Basophils Absolute: 0 10*3/uL (ref 0.0–0.1)
Basophils Relative: 1 % (ref 0–1)
Eosinophils Absolute: 0.1 10*3/uL (ref 0.0–0.7)
Eosinophils Absolute: 0.1 10*3/uL (ref 0.0–0.7)
Eosinophils Relative: 1 % (ref 0–5)
Eosinophils Relative: 1 % (ref 0–5)
Lymphocytes Relative: 44 % (ref 12–46)
Lymphocytes Relative: 46 % (ref 12–46)
Lymphocytes Relative: 52 % — ABNORMAL HIGH (ref 12–46)
Lymphs Abs: 3.2 10*3/uL (ref 0.7–4.0)
Lymphs Abs: 3.3 10*3/uL (ref 0.7–4.0)
Monocytes Absolute: 0.5 10*3/uL (ref 0.1–1.0)
Neutrophils Relative %: 39 % — ABNORMAL LOW (ref 43–77)
Neutrophils Relative %: 46 % (ref 43–77)

## 2010-09-19 LAB — MAGNESIUM: Magnesium: 2 mg/dL (ref 1.5–2.5)

## 2010-09-19 LAB — CARDIAC PANEL(CRET KIN+CKTOT+MB+TROPI)
Relative Index: 1.1 (ref 0.0–2.5)
Total CK: 101 U/L (ref 7–232)
Troponin I: <0.01 ng/mL (ref 0.00–0.06)

## 2010-09-19 LAB — HEMOGLOBIN A1C: Mean Plasma Glucose: 148 mg/dL — ABNORMAL HIGH (ref <117)

## 2010-09-25 ENCOUNTER — Ambulatory Visit: Payer: Medicare Other | Admitting: Physical Medicine & Rehabilitation

## 2010-10-01 ENCOUNTER — Encounter: Payer: Medicare Other | Attending: Physical Medicine & Rehabilitation

## 2010-10-01 ENCOUNTER — Ambulatory Visit: Payer: Medicare Other | Admitting: Physical Medicine & Rehabilitation

## 2010-10-01 DIAGNOSIS — I69993 Ataxia following unspecified cerebrovascular disease: Secondary | ICD-10-CM | POA: Insufficient documentation

## 2010-10-01 DIAGNOSIS — G8110 Spastic hemiplegia affecting unspecified side: Secondary | ICD-10-CM

## 2010-10-01 DIAGNOSIS — I69959 Hemiplegia and hemiparesis following unspecified cerebrovascular disease affecting unspecified side: Secondary | ICD-10-CM | POA: Insufficient documentation

## 2010-10-01 DIAGNOSIS — M216X9 Other acquired deformities of unspecified foot: Secondary | ICD-10-CM | POA: Insufficient documentation

## 2010-10-01 DIAGNOSIS — Z7901 Long term (current) use of anticoagulants: Secondary | ICD-10-CM | POA: Insufficient documentation

## 2010-11-13 ENCOUNTER — Encounter: Payer: Medicare Other | Attending: Physical Medicine & Rehabilitation

## 2010-11-13 ENCOUNTER — Ambulatory Visit: Payer: Medicare Other | Admitting: Physical Medicine & Rehabilitation

## 2010-11-13 DIAGNOSIS — Z7901 Long term (current) use of anticoagulants: Secondary | ICD-10-CM | POA: Insufficient documentation

## 2010-11-13 DIAGNOSIS — I69959 Hemiplegia and hemiparesis following unspecified cerebrovascular disease affecting unspecified side: Secondary | ICD-10-CM | POA: Insufficient documentation

## 2010-11-13 DIAGNOSIS — I69993 Ataxia following unspecified cerebrovascular disease: Secondary | ICD-10-CM | POA: Insufficient documentation

## 2010-11-13 DIAGNOSIS — M216X9 Other acquired deformities of unspecified foot: Secondary | ICD-10-CM | POA: Insufficient documentation

## 2010-11-20 NOTE — Cardiovascular Report (Signed)
[name], [name]           ACCOUNT NO.:  [id number]   MEDICAL RECORD NO.:  [id number]          PATIENT TYPE:  OIB   LOCATION:  3711                         FACILITY:  [facility]   PHYSICIAN:  Madaline Savage, M.D.DATE OF BIRTH:  August 05, 1945   DATE OF PROCEDURE:  01/01/2008  DATE OF DISCHARGE:                            CARDIAC CATHETERIZATION   PROCEDURES PERFORMED:  1. Selective coronary angiography by Judkins technique.  2. Retrograde left heart catheterization.  3. Left ventricular angiography.  4. Suprarenal abdominal aortography for visualization of the renal      arteries in view of the patient's hypertension.  5. Selective visualization of the left internal mammary artery graft      to the left anterior descending.  6. Selective visualization of a saphenous vein graft to diagonal #1.  7. Selective visualization of a left radial graft to the      posterolateral branch of the right coronary artery.  8. Selective visualization of a saphenous vein graft to the posterior      descending.  9. Selective visualization of obtuse marginal vein graft which was      occluded proximally.   RESULTS:  The patient's native coronary arteries were all severely and  diffusely diseased and were small in caliber.  There was retrograde  collateralization from the left coronary arterial tree to the right  coronary arterial tree.   The both renal arteries are normal.   All grafts were patent with the exception of a vein graft to the obtuse  marginal branch #1 which was occluded.  Left ventricular angiography  showed hypokinesis of the anterolateral wall, a very mild degree.  Overall ejection fraction was considered 45%-50%, which duplicates what  it was at the time of the patient's last cardiac catheterization from an  unknown date.   FINAL IMPRESSION:  1. Patent grafts to all distal targets except for saphenous vein graft      obtuse marginal branch #1.  2. Both renal arteries  normal.  3. Left ventricular ejection fraction 45%.   PLAN:  Medical therapy.  Also, we plan to continue the patient's  labetalol dosing for his elevated blood pressure, the fact that he did  not receive his beta-blocker dose this morning impacts on the elevated  blood pressure seen at the time the patient started the cardiac  catheterization today.           ______________________________  Madaline Savage, M.D.     WHG/MEDQ  D:  01/01/2008  T:  01/01/2008  Job:  [id number]

## 2010-11-20 NOTE — Procedures (Signed)
EEG NUMBER:  [id number].   AGE:  65.   GENDER:  Male.   LOCATION:  Outpatient.   REFERRED BY:  Dr. Chestine Spore.   History given is that of a 65 year old male with history of 2 strokes,  weakness in his legs and frequent falls.  The patient is seen as an  outpatient.   MEDICATIONS:  Reglan, Coumadin, Protonix, Altace,  furosemide,  Diltiazem, metformin, Pletal, atorvastatin, calcium.   Activation procedures were only performed in form of photic stimulation  during this 16 channel EEG recording with 1 channel representing heart  rate and rhythm exclusively.  The patient was described as a right-  handed male, awake and asleep recorded, with appropriate behavior.   DESCRIPTION:  A posterior dominant rhythm was established for this  patient at 8 Hz.  There was slowing noticed over the F4-C4 electrode,  which could represent an underlying ischemic lesion.  The patient went  frequently through phases of drowsiness alternating with wakefulness.  EKG remained a normal sinus rhythm throughout.  Sleep architecture  appeared symmetric.  Frontal polar muscle artifact was noticed.  No  epileptiform discharges or focal abnormalities were seen besides the  mentioned C4 slowing.      Melvyn Novas, M.D.  Electronically Signed     [id number]  D:  05/12/2007 18:51:57  T:  05/13/2007 13:46:43  Job #:  [id number]   cc:   Margaretmary Bayley, M.D.  Fax: [phone]

## 2010-11-20 NOTE — Consult Note (Signed)
Roger Berger, Roger Berger           ACCOUNT NO.:  [id number]   MEDICAL RECORD NO.:  [id number]          PATIENT TYPE:  OUT   LOCATION:  XRAY                         FACILITY:  [facility]   PHYSICIAN:  Sanjeev K. Deveshwar, M.D.DATE OF BIRTH:  [birth year]   DATE OF CONSULTATION:  01/15/2007  DATE OF DISCHARGE:                                 CONSULTATION   CHIEF COMPLAINT:  Cerebrovascular disease.   HISTORY OF PRESENT ILLNESS:  This is a pleasant 65 year old male who was  recently seen at [facility] on December 20 2006  after falling and hitting his head.  The patient has been on Coumadin  for history of cerebrovascular disease with a previous CVA in 1999.  He  had a head CT scan in the emergency department that was negative for any  acute findings.  His INR while in the emergency department was 1.4.  The  patient denied loss of consciousness.  Apparently, there were some  abrasions, but no significant injury.  The patient reports another fall  within several weeks of that previous fall.  He has a history of left-  sided weakness from his CVA in 1999.  He feels that the weakness has  become progressively worse over the past several months.  Dr. Margaretmary Bayley has referred the patient to Dr. Corliss Skains to undergo further  evaluation.   As noted, the patient has a history of cerebrovascular disease in  December 2000.  The patient underwent PTA stenting of the cavernous  segment of the right internal carotid artery performed by Dr. Corliss Skains.  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  In 2003, the patient also had a PTA stent of the proximal  right iliac artery performed by Dr. Corliss Skains secondary to symptoms of  claudication.  The patient presents today with his wife and son to  discuss further recommendations.   PAST MEDICAL HISTORY:  1. Diabetes mellitus.  2. 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  3. History of  hypertension.  4. History of coronary artery disease.  His cardiologist is Dr.      Chanda Busing.  5. The patient also has peripheral vascular disease.  6. He had a GI bleed in April 2007; at that time, he was on Coumadin,      aspirin and Plavix.  Apparently, the source of the bleeding was      never found.  He was taken off aspirin and Plavix and continued on      Coumadin.  He had some anemia associated with that bleed and      required a transfusion.   SURGICAL HISTORY:  1. Bypass graft surgery on the left lower extremity performed by Dr.      Arbie Cookey, the patient believes in the 47s.  2. He also had some testicular surgery performed by Dr. Brunilda Payor.   The patient denies any previous problems with anesthesia.   ALLERGIES:  NO KNOWN DRUG ALLERGIES.  He denies allergies to contrast or  Latex.   CURRENT MEDICATIONS:  1. Altace 10 mg q.a.m., 5  mg q.p.m.  2. Coumadin 5 mg alternating with 2.5 mg every other day.  3. Diltiazem 240 mg daily.  4. Lasix 40 mg daily.  5. Metoprolol 100 mg b.i.d.  6. NovoLog insulin.  7. Protonix.  8. Lipitor.  9. Pletal.  10.He was recently started on metformin   SOCIAL HISTORY:  The patient is married.  He has 4 children.  He lives  in [place].  He quit smoking 40 years ago; he was not a heavy smoker.  He has no significant alcohol history.  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   FAMILY HISTORY:  His mother is alive at age 55; she has no significant  medical illnesses.  His father died at age 85; he had cerebrovascular  disease as well as coronary artery disease.   IMPRESSION AND PLAN:  As noted, the patient presents today for further  evaluation by Dr. Corliss Skains regarding his cerebrovascular disease.  He  has had 2 recent falls and has noted and intermittent increase in his  left lower extremity weakness.  Dr. Corliss Skains recommended MRI of the  head to rule out a CVA.  He also recommended a cerebral angiogram  to  evaluate the status of his cerebrovascular disease.  As noted, the  patient is on Coumadin.  He has been scheduled for his cerebral  angiogram on July 18.  He was told to stop his Coumadin after his dose  on the 13th.  On the 14th, he will start aspirin 81 mg daily to be  continued until after the angiogram.  We will resume the Coumadin on the  evening of the 18th following his angiogram.   All of the patient's and his family's questions were answered.  Greater  than 30 minutes were spent on this consult.      Delton See, P.A.    ______________________________  Grandville Silos. Corliss Skains, M.D.    DR/MEDQ  D:  01/15/2007  T:  01/16/2007  Job:  [id number]   cc:   Margaretmary Bayley, M.D.  Madaline Savage, M.D.  Pramod P. Pearlean Brownie, MD

## 2010-11-23 NOTE — Op Note (Signed)
NAMEIMRE, VECCHIONE           ACCOUNT NO.:  [id number]   MEDICAL RECORD NO.:  [id number]          PATIENT TYPE:  INP   LOCATION:  4705                         FACILITY:  [facility]   PHYSICIAN:  Bernette Redbird, M.D.   DATE OF BIRTH:  [birth year]   DATE OF PROCEDURE:  11/06/2005  DATE OF DISCHARGE:                                 OPERATIVE REPORT   PROCEDURE:  Upper endoscopy with biopsy.   INDICATIONS:  65 year old African American male on aspirin, Plavix and  Coumadin, who over the past month, has had an abrupt drop in hemoglobin from  approximately 13 to 6.5, without any clinically obvious episode of  hemorrhage, although he has had dark stools recently.   FINDINGS:  Several polypoid nodules in the antral region of the stomach but  no definite source of bleeding identified.   PROCEDURE:  The nature, purpose, and risks of the procedure have been  discussed with the patient who provided written consent.   SEDATION:  Fentanyl 50 mcg and Versed 8 mg IV without arrhythmias or  desaturation.   The patient was slightly combative with initial passage of the scope but we  were able to perform the remainder of the procedure without undue  difficulty.   The vocal cords were seen.  The esophagus was readily entered and was normal  in its entirety without evidence of Mallory-Weiss tear, reflux esophagitis,  Barrett's esophagus, varices, infection, neoplasia or any significant ring,  stricture, or hiatal hernia.  A minimal hiatal hernia appeared to be  present.   The stomach contained no blood or coffee-ground material.  In the antral  region, there were approximately 8 or 10 smooth, slightly erythematous,  roughly 4 mm to 6 mm nodules or polyps, several of which (the more prominent  ones) I biopsied, and I noted that they bled rather freely although this may  be due to residual aspirin and Plavix effect.  I did not see any erosions,  ulcers or vascular ectasia to account for the  patient's recent blood loss,  including a retroflexed view of the proximal stomach.  No frank masses or  cancer were seen and there was no diffuse gastritis.  The pylorus, duodenal  bulb and second duodenum were unremarkable.   The scope was removed from the patient tolerated procedure well without  apparent complications.   IMPRESSION:  1.  Melena, without obvious source identified on current examination      (578.1).  2.  Gastric nodules of uncertain clinical significance.   PLAN:  1.  Await pathology results on current biopsies.  2.  Proceed to colonoscopic evaluation.           ______________________________  Bernette Redbird, M.D.     RB/MEDQ  D:  11/06/2005  T:  11/06/2005  Job:  [id number]   cc:   Margaretmary Bayley, M.D.  Fax: [phone]

## 2010-11-23 NOTE — Discharge Summary (Signed)
[name], [name]           ACCOUNT NO.:  [id number]   MEDICAL RECORD NO.:  [id number]          PATIENT TYPE:  INP   LOCATION:  4705                         FACILITY:  [facility]   PHYSICIAN:  Roger Berger, M.D.    DATE OF BIRTH:  June 22, 1946   DATE OF ADMISSION:  11/03/2005  DATE OF DISCHARGE:  11/11/2005                                 DISCHARGE SUMMARY   DISCHARGE DIAGNOSES:  1.  Gastrointestinal hemorrhage with site of bleeding not identified.  2.  Chronic gastrointestinal blood loss related anemia.  3.  Chronic use of anticoagulants systemically.  4.  Abnormal INR.  5.  Atherosclerotic heart disease and coronary artery disease.  6.  Systemic hypertension.  7.  Past history of cerebrovascular accident.  8.  Arteriosclerotic peripheral vascular disease.   REASON FOR ADMISSION:  This is one of several [place] hospitalizations  for Roger Berger, a 65 year old hypertensive, insulin-requiring diabetic who  presented to the emergency room with a chief complaint of progressive  weakness, dizziness, abdominal pain, and nausea.  During his evaluation in  the emergency room, the patient was noted to have strongly positive stool  for blood and anemia which was felt to be related.  The patient had been on  Plavix as well as aspirin for recurrent CVAs  as well as coronary artery  disease and peripheral vascular disease.   PERTINENT LABORATORY AND X-RAY DATA:  The patient did undergo both upper and  lower endoscopies with random biopsies performed.  Biopsy of the upper  endoscopy revealed gastric mucosa with Meckel features of hyperplastic and  fundic gland polyps.  There was no evidence of any GI bleeding from these  sites and there was no evidence of dysplasia or malignancy.  A colonoscopy  performed was perfectly normal with the exception of some dark old blood  throughout the colon.  The patient's admission pH was 7.47 with a pCO2 of  36.  CBC:  His admission hemoglobin was 6.5  with hematocrit of 19.  Post  transfusion initial hemoglobin is 8 with a hematocrit of 24.  His admission  INR is 3.2.  CMP potassium is 3.3, which is low, glucose of 233.  Normal BUN  of 22, chloride of 104, sodium 136.  He had a low calcium at 8.  Total  protein likewise low at 5.5 and albumin low at 3.2.  His glycated hemoglobin  is high at 7.7.  Total cholesterol is 108 with an LDL cholesterol of 55.  TSH normal at 2.3, free T3 of 2.5.  CT scan performed with contrast was  completely negative in the pelvis.  The liver was normal in appearance.  No  gallstones.  Spleen was normal as well as the pancreas.  Adrenal glands were  likewise normal.  He did have evidence of severe arteriosclerotic disease of  the aorta, but no other pathologic finding.  His chest x-ray revealed a  normal sized heart.  His lungs were clear.  There were old surgical changes  of a previous coronary artery bypass graft.   HOSPITAL COURSE:  [name] was admitted with melena grossly.  He had  been treated as an outpatient with Plavix as well as aspirin for his  significant arteriosclerotic cardiovascular disease.  On admission his  potassium, which was low, was replaced parenterally.  Because of his  significant coronary disease and arteriosclerotic cerebrovascular disease,  it was decided that the patient should be aggressively transfused to a level  that was less risky for predisposing him to another cardiovascular event.  He was changed over from his 75/25 to Lantus insulin  along with a  fractional schedule of NovoLog based on his preprandial blood sugars.   With transfusion, the patient's condition remained stable.  There was no  subsequent event of significant GI bleeding and no orthostatic changes in  his blood pressure or pulse rate.  The patient was seen in consultation by  Dr. Bosie Clos and Dr. Matthias Hughs, both of whom felt that upper and lower GI  endoscopies should be done to attempt to identify a  pathological process and  to localize a bleeding site.  Unfortunately, despite 2 excellent studies  without any complications, no obvious GI bleeding site was noted.  There  were benign polyps in the stomach; however, there was no evidence that there  was any bleeding from either one of these areas, and the subsequent  pathology on the biopsy revealed that these were benign polyps.  No  malignant changes were noted.  With continued stabilization of his  hematocrit, hemoglobin, blood pressure, and having discontinued his aspirin,  Plavix, the patient was subsequently discharged home with his hematocrit and  hemoglobin to be monitored closely over the next 2 weeks.  He is scheduled  to come in to be seen by nurse  twice a week for the next 2 weeks.   His discharge condition is significantly improved.  As noted above, his  aspirin and Plavix both were discontinued.  He is discharged home on an 1800-  calorie, carb-modified, no-added-salt diet.   MEDICATIONS:  1.  Altace at 10 mg b.i.d.  2.  Furosemide 40 mg per day.  3.  Nexium 40 mg each evening before supper.  4.  Vytorin 10/80 one daily.  5.  Toprol XL 50 mg twice a day.           ______________________________  Roger Berger, M.D.     PC/MEDQ  D:  12/19/2005  T:  12/19/2005  Job:  [id number]

## 2010-11-23 NOTE — H&P (Signed)
Roger Berger. [facility]  Patient:    Roger Berger                   MRN: [id number] Adm. Date:  [id number] Attending:  Oneal Grout                         History and Physical  CHIEF COMPLAINT:  Status post right internal carotid artery stenosis stenting and angioplasty.  HISTORY OF PRESENT ILLNESS:  Patient is a 64 year old man with a past medical history of a right hemispheric infarction, right internal carotid artery stenosis, petrous and siphon portion intracranially, whom I have seen in the past -- October of 2000 -- for evaluation of worsening of left-sided weakness.  Patient has been on Coumadin with chronic treatment for secondary stroke prevention.  He recently was admitted to the neuro-interventional vascular service at [facility] for an endovascular procedure which consisted of angioplasty and stenting of the petrous portion of the internal carotid artery and angioplasty of the siphon portion of the right internal carotid artery.  Patient was placed under general  anesthesia and the procedure was performed by Dr. Kerby Nora successfully without any complications.  He was transported to the PACU/neuro-ICU for further monitoring and observation.  At the time of interview, he is still drowsy from anesthesia but very arousable, able to follow commands and he denies any complaints.  PAST MEDICAL HISTORY:  Hypertension; diabetes; coronary artery disease, status ost CABG; and stroke.  MEDICATIONS: 1. Furosemide 20 mg once a day. 2. Toprol XL 100 mg q.d. 3. Cardizem extended release 240 mg q.d. 4. Ecotrin 325 mg q.d. 5. Altace 2.5 mg once a day.  ALLERGIES:  No known drug allergies.  SOCIAL HISTORY:  Lives with his father.  He quit smoking several years ago.  He  denies drinking alcohol.  FAMILY HISTORY:  Noncontributory.  REVIEW OF SYSTEMS:  As per history of present illness.  PHYSICAL EXAMINATION:  VITAL  SIGNS:  Blood pressure 145/83.  Pulse is 86.  Respirations 20. Temperature 98.0.  GENERAL:  Patient lying on the bed without any distress.  HEENT:  Head normocephalic, atraumatic.  NECK:  Supple.  No bruits.  LUNGS:  Clear bilaterally.  HEART:  Regular and rhythmic.  No murmurs.  ABDOMEN:  Soft.  Bowel sounds present.  No tenderness.  No visceromegaly. There is an arterial line placed in the right groin; no hematoma; no bleeding.  EXTREMITIES:  Positive pulses bilaterally.  NEUROLOGIC:  He is drowsy but arousable and able to follow commands and responds appropriately.  Pupils equal, round and reactive.  Extraoculocephalic movement intact.  Face symmetric.  Tongue in the midline.  Palate elevates symmetrically. Motor examination:  Strength equal bilaterally, 5/5, both upper and lower extremities.  Reflexes +1 throughout.  Plantars downgoing.  Gait was not evaluated at this time.  Sensory intact to touch and pinprick.  IMPRESSION: 1. Angioplasty and stenting of the right petrous portion of the internal carotid    artery and angioplasty of the right siphon portion of the right internal carotid    artery. 2. Hypertension. 3. Diabetes mellitus. 4. Coronary artery disease.  PLAN AND RECOMMENDATIONS:  Patient will be admitted to the medical intensive care unit for close monitoring and assessment of his neuro-status.  He will remain on ReoPro drip for the next 10 hours and a heparin drip 500 U/hr as well. Hemodynamic support will be provided with normal saline  IV fluids 100 cc/hr.  Patient also ill remain treated with antiplatelet therapy, aspirin and Plavix, for secondary stroke prevention.  Arterial sheath will be removed tomorrow morning from the right groin. A.m. labs will be obtained including CBC, PT, INR and PTT.  Thank you for allowing me to participate in the care of this patient. DD:  06/12/99 TD:  06/13/99 Job: [id number]

## 2010-12-07 ENCOUNTER — Encounter: Payer: Medicare Other | Attending: Physical Medicine & Rehabilitation

## 2010-12-07 ENCOUNTER — Ambulatory Visit: Payer: Medicare Other | Admitting: Physical Medicine & Rehabilitation

## 2010-12-07 DIAGNOSIS — Z7901 Long term (current) use of anticoagulants: Secondary | ICD-10-CM | POA: Insufficient documentation

## 2010-12-07 DIAGNOSIS — I69993 Ataxia following unspecified cerebrovascular disease: Secondary | ICD-10-CM

## 2010-12-07 DIAGNOSIS — G8110 Spastic hemiplegia affecting unspecified side: Secondary | ICD-10-CM

## 2010-12-07 DIAGNOSIS — M216X9 Other acquired deformities of unspecified foot: Secondary | ICD-10-CM | POA: Insufficient documentation

## 2010-12-07 DIAGNOSIS — I69959 Hemiplegia and hemiparesis following unspecified cerebrovascular disease affecting unspecified side: Secondary | ICD-10-CM | POA: Insufficient documentation

## 2010-12-07 NOTE — Assessment & Plan Note (Signed)
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  HISTORY:  A 65 year old male with history of vertebrobasilar disease. 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  He has also had a history of prior right CVA with left hemiparesis causing footdrop, uses AFO chronically.  He is followed up with Dr. Pearlean Brownie, since I last saw him on October 01, 2010.  No new medication changes.  He continues on Plavix, continues with goal of LDL of 80 or less.  He has no pain complaints.  His walking tolerance is 20 minutes.  He has finished up physical therapy and is ambulating with a pick-up walker. He is doing some limited driving.  SOCIAL HISTORY:  Married, lives with his wife.  His son drives him here to the clinic.  PHYSICAL EXAMINATION:  VITAL SIGNS:  Blood pressure 122/45, pulse 63, respirations 18, O2 sat 97% on room air. GENERAL:  No acute distress.  Mood and affect appropriate. MUSCULOSKELETAL:  Left upper extremity has severe ataxia finger-nose- finger as well as heel-to-shin testing.  His motor strength is 4- over 5 in the left deltoid biceps triceps grip, 5/5 on the right side.  His left ankle was 0/5 in ankle dorsiflexors and 4/5 in the knee extensors.  IMPRESSION: 1. History of right cerebrovascular accident with left foot drop. 2. 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  PLAN:  I will see him back in 3 months in followup.  Continue with his home exercise program.  Continue driving restrictions of no nights, no interstate.  Discussed with patient and son, agree with plan.     Erick Colace, M.D. Electronically Signed    AEK/MedQ D:  12/07/2010 13:37:07  T:  12/07/2010 22:09:25  Job #:  [id number]  cc:   Pramod P. Pearlean Brownie, MD Fax: [phone]  Margaretmary Bayley, M.D. Fax: [phone]

## 2011-02-25 ENCOUNTER — Inpatient Hospital Stay (INDEPENDENT_AMBULATORY_CARE_PROVIDER_SITE_OTHER)
Admission: RE | Admit: 2011-02-25 | Discharge: 2011-02-25 | Disposition: A | Discharge status: Home / Self Care | Payer: Medicare Other | Source: Ambulatory Visit | Attending: Family Medicine | Admitting: Family Medicine

## 2011-02-25 DIAGNOSIS — K047 Periapical abscess without sinus: Secondary | ICD-10-CM

## 2011-03-21 ENCOUNTER — Ambulatory Visit: Payer: Medicare Other | Admitting: Physical Medicine & Rehabilitation

## 2011-03-21 ENCOUNTER — Encounter: Payer: Medicare Other | Attending: Physical Medicine & Rehabilitation

## 2011-03-21 DIAGNOSIS — M216X9 Other acquired deformities of unspecified foot: Secondary | ICD-10-CM | POA: Insufficient documentation

## 2011-03-21 DIAGNOSIS — I69993 Ataxia following unspecified cerebrovascular disease: Secondary | ICD-10-CM

## 2011-03-21 DIAGNOSIS — Z7901 Long term (current) use of anticoagulants: Secondary | ICD-10-CM | POA: Insufficient documentation

## 2011-03-21 DIAGNOSIS — I69959 Hemiplegia and hemiparesis following unspecified cerebrovascular disease affecting unspecified side: Secondary | ICD-10-CM | POA: Insufficient documentation

## 2011-03-25 NOTE — Assessment & Plan Note (Signed)
REASON FOR VISIT:  Left-sided incoordination related to CVA.  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 He had a prior stroke on the right side causing left hemiparesis and foot drop and has used AFO chronically for the prior stroke.  He had he has followed up with Dr. Margaretmary Bayley, seeing him last month.  No change to medications.  He last saw me on December 07, 2010.  He last saw Dr. Pearlean Brownie, prior to June.  He has no pain.  His sleep is good.  He can walk 10 minutes at a time. He is at the [facility] doing in aerobics class it sounds like an adaptive class.  He is disabled from work last having worked in 2000.  REVIEW OF SYSTEMS:  Positive for blood sugar regulation issues.  PAST HISTORY:  Diabetes, heart disease, hypertension, CVA as noted above.  SOCIAL HISTORY:  Married, lives with his wife.  His son drives him.  He was okay to drive during the daytime hours, but he really does not feel comfortable doing this and has not resumed driving.  PHYSICAL EXAMINATION:  VITAL SIGNS:  Blood pressure 121/47, pulse 60, respirations 16, and O2 sat 98% on room air. GENERAL:  Gait, he does have of limb ataxia with decreased foot placement on the left side, widened base support.  Needs to really hold onto the exam table to ambulate.  He forgot his cane today.  He has moderate limb ataxia on finger-nose-finger testing, heel-to-shin testing on the left side, but intact on the right side.  He has a left ankle foot drop and in double metal upright AFO.  Mood and affect are appropriate.  Speech is without dysarthria.  IMPRESSION: 1. History of left pontine infarct is interrupted the pontocerebellar     fibers causing left-sided hemiataxia. 2. Right cerebrovascular accident causing left hemiparesis.  His only     residual is the left foot drop.  PLAN:  I will see him back in about 4 months.  I  encouraged to continue with his [employer] program.  Answered questions.     Erick Colace, M.D. Electronically Signed    AEK/MedQ D:  03/21/2011 13:24:41  T:  03/21/2011 14:15:43  Job #:  [id number]  cc:   Pramod P. Pearlean Brownie, MD Fax: [phone]  Margaretmary Bayley, M.D. Fax: [phone]

## 2011-03-28 LAB — BUN: BUN: 10

## 2011-04-02 ENCOUNTER — Ambulatory Visit: Payer: Medicare Other | Admitting: Physical Medicine & Rehabilitation

## 2011-04-04 LAB — BASIC METABOLIC PANEL
BUN: 10
CO2: 28
Chloride: 109
Creatinine, Ser: 1.12

## 2011-04-04 LAB — CBC
MCHC: 34.6
MCV: 94.5
Platelets: 228
RDW: 15.5

## 2011-04-22 LAB — BASIC METABOLIC PANEL
BUN: 12
Calcium: 9
Chloride: 107
Creatinine, Ser: 1.26
GFR calc Af Amer: >60
GFR calc non Af Amer: 58 — ABNORMAL LOW

## 2011-04-22 LAB — PROTIME-INR
INR: 1
Prothrombin Time: 13.5

## 2011-04-22 LAB — CBC
MCV: 94.6
Platelets: 252
WBC: 7.4

## 2011-04-25 LAB — I-STAT 8, (EC8 V) (CONVERTED LAB)
BUN: 11
Chloride: 106
Glucose, Bld: 141 — ABNORMAL HIGH
HCT: 47
Hemoglobin: 16
Operator id: 196461
Sodium: 142
pCO2, Ven: 47.3

## 2011-04-25 LAB — PROTIME-INR: Prothrombin Time: 18.1 — ABNORMAL HIGH

## 2011-07-23 ENCOUNTER — Encounter: Payer: Medicare Other | Attending: Physical Medicine & Rehabilitation

## 2011-07-23 ENCOUNTER — Ambulatory Visit: Payer: Medicare Other | Admitting: Physical Medicine & Rehabilitation

## 2011-07-23 DIAGNOSIS — I69993 Ataxia following unspecified cerebrovascular disease: Secondary | ICD-10-CM | POA: Insufficient documentation

## 2011-07-23 DIAGNOSIS — M216X9 Other acquired deformities of unspecified foot: Secondary | ICD-10-CM | POA: Insufficient documentation

## 2011-07-23 DIAGNOSIS — I519 Heart disease, unspecified: Secondary | ICD-10-CM | POA: Insufficient documentation

## 2011-07-23 DIAGNOSIS — I69959 Hemiplegia and hemiparesis following unspecified cerebrovascular disease affecting unspecified side: Secondary | ICD-10-CM | POA: Insufficient documentation

## 2011-07-23 DIAGNOSIS — E119 Type 2 diabetes mellitus without complications: Secondary | ICD-10-CM | POA: Insufficient documentation

## 2011-07-23 DIAGNOSIS — I10 Essential (primary) hypertension: Secondary | ICD-10-CM | POA: Insufficient documentation

## 2011-07-23 NOTE — Assessment & Plan Note (Signed)
REASON FOR VISIT:  Problems with walking.  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  Prior stroke on the right causing left hemiparesis and a foot drop and used AFO chronically prior to the most recent stroke.  He is followed up medically with Dr. Margaretmary Bayley.  He is not seeing Dr. Pearlean Brownie anymore.  His last visit with me was March 21, 2011.  He has had no pain.  His sleep is good.  He can walk 10 minutes at a time.  He has been going to [facility] to do aerobic class.  REVIEW OF SYSTEMS:  Positive for trouble walking, otherwise negative. He does use a wheelchair.  He is looking for another walker which has wheels and breaks.  PAST HISTORY:  Diabetes, hypertension as well as heart disease.  SOCIAL HISTORY:  Married, lives with his wife.  His son brought him today.  PHYSICAL EXAMINATION:  VITAL SIGNS:  Blood pressure 135/38, pulse 74, respiratory rate is 16 and O2 sat 94% on room air.  GENERAL:  No acute distress.  Mood and affect appropriate. EXTREMITIES:  Have no tenderness to palpation.  He has positive ataxia, finger nose to finger testing and has limb ataxia noted during gait. Left lower extremity with decreased foot placement.  Needs to hold onto the exam table to ambulate or to myself.  His motor strength in the upper extremity is normal.  IMPRESSION: 1. Left pontine infarct affecting the pontocerebellar fibers causing     left hemiataxia 2. Right cerebrovascular accident causing left hemiparesis with foot     drop.  PLAN: 1. I will see him back in 6 months. 2. Continue [employer] program 3. Medical follow up with Dr. Chestine Spore.     Erick Colace, M.D. Electronically Signed    AEK/MedQ D:  07/23/2011 11:19:47  T:  07/23/2011 19:22:15  Job #:  [id number]

## 2012-01-21 ENCOUNTER — Ambulatory Visit: Payer: Medicare Other | Admitting: Physical Medicine & Rehabilitation

## 2012-01-28 ENCOUNTER — Encounter: Payer: Self-pay | Admitting: Physical Medicine & Rehabilitation

## 2012-01-28 ENCOUNTER — Encounter: Payer: Medicare Other | Attending: Physical Medicine & Rehabilitation

## 2012-01-28 ENCOUNTER — Ambulatory Visit (HOSPITAL_BASED_OUTPATIENT_CLINIC_OR_DEPARTMENT_OTHER): Payer: Medicare Other | Admitting: Physical Medicine & Rehabilitation

## 2012-01-28 VITALS — BP 134/50 | HR 64 | Resp 14 | Ht 71.0 in | Wt 190.0 lb

## 2012-01-28 DIAGNOSIS — I69993 Ataxia following unspecified cerebrovascular disease: Secondary | ICD-10-CM | POA: Insufficient documentation

## 2012-01-28 DIAGNOSIS — G8110 Spastic hemiplegia affecting unspecified side: Secondary | ICD-10-CM

## 2012-01-28 DIAGNOSIS — I69959 Hemiplegia and hemiparesis following unspecified cerebrovascular disease affecting unspecified side: Secondary | ICD-10-CM | POA: Insufficient documentation

## 2012-01-28 NOTE — Patient Instructions (Signed)
You may remove the left AFO during her exercise activities I want you to walk with her left AFO on I will see you back in 2 month

## 2012-01-28 NOTE — Progress Notes (Signed)
Subjective:    Patient ID: Roger Berger, male    DOB: 15-Oct-1945, 66 y.o.   MRN: [id number]  HPI No pain complaints. Still having knee hyperextension as well as some hip hyperextension even when wearing the AFO. Patient has not had any formal physical therapy but is now seeing a trainer at the [facility]. He like to know whether he can take his brace off to perform certain exercises such as elliptical training Pain Inventory Average Pain 0 Pain Right Now 0 My pain is aching  In the last 24 hours, has pain interfered with the following? General activity 0 Relation with others 0 Enjoyment of life 0 What TIME of day is your pain at its worst? day and evening Sleep (in general) Good  Pain is worse with: walking, bending, standing and some activites Pain improves with: rest, therapy/exercise and medication Relief from Meds: 7  Mobility walk with assistance use a walker how many minutes can you walk? 5 ability to climb steps?  no do you drive?  yes Do you have any goals in this area?  yes  Function disabled: date disabled 24 I need assistance with the following:  shopping Do you have any goals in this area?  no  Neuro/Psych weakness trouble walking  Prior Studies Any changes since last visit?  no  Physicians involved in your care Any changes since last visit?  no   Family History  Problem Relation Age of Onset  . Heart disease Father    History   Social History  . Marital Status: Married    Spouse Name: N/A    Number of Children: N/A  . Years of Education: N/A   Social History Main Topics  . Smoking status: Former Games developer  . Smokeless tobacco: None  . Alcohol Use: None  . Drug Use: None  . Sexually Active: None   Other Topics Concern  . None   Social History Narrative  . None   Past Surgical History  Procedure Date  . Coronary artery bypass graft    Past Medical History  Diagnosis Date  . Ataxia, late effect of cerebrovascular disease   .  Aphasia, late effect of cerebrovascular disease   . Cerebral thrombosis   . Stroke   . Coronary artery disease   . Hypertension   . Diabetes mellitus    BP 134/50  Pulse 64  Resp 14  Ht 5\' 11"  (1.803 m)  Wt 190 lb (86.183 kg)  BMI 26.50 kg/m2  SpO2 98%     Review of Systems  Musculoskeletal: Positive for myalgias, arthralgias and gait problem.  Neurological: Positive for weakness.  All other systems reviewed and are negative.       Objective:   Physical Exam  Constitutional: He is oriented to person, place, and time. He appears well-developed and well-nourished.  HENT:  Head: Normocephalic and atraumatic.  Right Ear: External ear normal.  Left Ear: External ear normal.  Eyes: Conjunctivae and EOM are normal. Pupils are equal, round, and reactive to light.  Neurological: He is alert and oriented to person, place, and time. He has normal strength. No cranial nerve deficit. He exhibits abnormal muscle tone. Coordination and gait abnormal.       Ataxia on finger nose to finger testing on the left upper extremity There is increased left elbow flexor tone 0/5 in the toe extensors 2 minus/5 in the left ankle dorsiflexors 3 minus/5 in the left ankle plantar flexors 4/5 in the quadricep muscles  Psychiatric:  He has a normal mood and affect.          Assessment & Plan:  1. 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 I'll see him back in 2 months after further training with his trainer at the [facility]. He may be able to improve his quad strength so he does not have hyperextension at the knee otherwise we may need to extend his AFO to  KAFO

## 2012-03-24 ENCOUNTER — Encounter: Payer: Medicare Other | Attending: Physical Medicine & Rehabilitation

## 2012-03-24 ENCOUNTER — Ambulatory Visit (HOSPITAL_BASED_OUTPATIENT_CLINIC_OR_DEPARTMENT_OTHER): Payer: Medicare Other | Admitting: Physical Medicine & Rehabilitation

## 2012-03-24 ENCOUNTER — Encounter: Payer: Self-pay | Admitting: Physical Medicine & Rehabilitation

## 2012-03-24 VITALS — BP 130/47 | HR 64 | Resp 14 | Ht 71.0 in | Wt 196.0 lb

## 2012-03-24 DIAGNOSIS — I69959 Hemiplegia and hemiparesis following unspecified cerebrovascular disease affecting unspecified side: Secondary | ICD-10-CM | POA: Insufficient documentation

## 2012-03-24 DIAGNOSIS — I69993 Ataxia following unspecified cerebrovascular disease: Secondary | ICD-10-CM

## 2012-03-24 DIAGNOSIS — G8110 Spastic hemiplegia affecting unspecified side: Secondary | ICD-10-CM

## 2012-03-24 NOTE — Progress Notes (Signed)
Subjective:    Patient ID: Roger Berger, male    DOB: Aug 21, 1945, 66 y.o.   MRN: [id number]  HPI No pain complaints. Still having knee hyperextension as well as some hip hyperextension even when wearing the AFO. Patient has not had any formal physical therapy but is now seeing a trainer at the [facility]. He like to know whether he can take his brace off to perform certain exercises such as elliptical training Also had questions whether he can use an ankle splint and a knee orthosis. Pain Inventory Average Pain 0 Pain Right Now 0 My pain is no pain  In the last 24 hours, has pain interfered with the following? General activity 0 Relation with others 0 Enjoyment of life 0 What TIME of day is your pain at its worst? no pain Sleep (in general) Good  Pain is worse with: no pain Pain improves with: rest, pacing activities and no pain Relief from Meds: no pain  Mobility use a cane use a walker how many minutes can you walk? 5 ability to climb steps?  no do you drive?  yes  Function disabled: date disabled 1999  Neuro/Psych trouble walking  Prior Studies Any changes since last visit?  no  Physicians involved in your care Any changes since last visit?  no   Family History  Problem Relation Age of Onset  . Heart disease Father    History   Social History  . Marital Status: Married    Spouse Name: N/A    Number of Children: N/A  . Years of Education: N/A   Social History Main Topics  . Smoking status: Former Games developer  . Smokeless tobacco: Never Used  . Alcohol Use: None  . Drug Use: None  . Sexually Active: None   Other Topics Concern  . None   Social History Narrative  . None   Past Surgical History  Procedure Date  . Coronary artery bypass graft    Past Medical History  Diagnosis Date  . Ataxia, late effect of cerebrovascular disease   . Aphasia, late effect of cerebrovascular disease   . Cerebral thrombosis   . Stroke   . Coronary artery disease     . Hypertension   . Diabetes mellitus    BP 130/47  Pulse 64  Resp 14  Ht 5\' 11"  (1.803 m)  Wt 196 lb (88.905 kg)  BMI 27.34 kg/m2  SpO2 98%    Review of Systems  Musculoskeletal: Positive for gait problem.  All other systems reviewed and are negative.       Objective:   Physical Exam  Musculoskeletal:       Left ankle: He exhibits decreased range of motion and swelling. He exhibits no ecchymosis and no deformity. no tenderness. Achilles tendon normal.  Neurological: A sensory deficit is present. He exhibits abnormal muscle tone. Gait abnormal.       Left side sensory deficit Motor strength in the left lower extremity is 4 minus quad 4 at the ankle dorsiflexor and plantar flexor 3 at the foot evertor Increased equina varus spasticity Inversion of the left foot with standing with hyperextension of the knee          Assessment & Plan:  1. 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 He has good ankle dorsiflexion and fair ankle in eversion however he does have inversion at the ankle during standing. He will benefit from ankle splint which provides medial lateral stability but still allows active and passive ankle  dorsiflexion and plantar flexion In terms of his knee, hyperextension during standing is a problem.Knee orthosis may be helpful. We'll prescribe I'll see him back in one month to reeval

## 2012-03-24 NOTE — Patient Instructions (Signed)
You will need to call [facility] at [phone]

## 2012-04-21 ENCOUNTER — Encounter: Payer: Self-pay | Admitting: Physical Medicine & Rehabilitation

## 2012-04-21 ENCOUNTER — Ambulatory Visit (HOSPITAL_BASED_OUTPATIENT_CLINIC_OR_DEPARTMENT_OTHER): Payer: Medicare Other | Admitting: Physical Medicine & Rehabilitation

## 2012-04-21 ENCOUNTER — Encounter: Payer: Medicare Other | Attending: Physical Medicine & Rehabilitation

## 2012-04-21 VITALS — BP 142/50 | HR 64 | Resp 14 | Ht 71.0 in | Wt 197.0 lb

## 2012-04-21 DIAGNOSIS — G8110 Spastic hemiplegia affecting unspecified side: Secondary | ICD-10-CM

## 2012-04-21 DIAGNOSIS — I69993 Ataxia following unspecified cerebrovascular disease: Secondary | ICD-10-CM | POA: Insufficient documentation

## 2012-04-21 DIAGNOSIS — I69959 Hemiplegia and hemiparesis following unspecified cerebrovascular disease affecting unspecified side: Secondary | ICD-10-CM | POA: Insufficient documentation

## 2012-04-21 NOTE — Patient Instructions (Signed)
Continue your training at the [employer]. Please bring your braces and next time I see you in December

## 2012-04-21 NOTE — Progress Notes (Signed)
  Subjective:    Patient ID: Roger Berger, male    DOB: [birth year], 66 y.o.   MRN: [id number]  HPI  Pain Inventory Average Pain 0 Pain Right Now 0 My pain is intermittent  In the last 24 hours, has pain interfered with the following? General activity 0 Relation with others 0 Enjoyment of life 0 What TIME of day is your pain at its worst? n/a Sleep (in general) Fair  Pain is worse with: inactivity and some activites Pain improves with: medication Relief from Meds: 10  Mobility use a walker how many minutes can you walk? 5 ability to climb steps?  no do you drive?  yes use a wheelchair Do you have any goals in this area?  yes  Function disabled: date disabled 1999 Do you have any goals in this area?  no  Neuro/Psych bladder control problems trouble walking  Prior Studies Any changes since last visit?  no  Physicians involved in your care Any changes since last visit?  no   Family History  Problem Relation Age of Onset  . Heart disease Father    History   Social History  . Marital Status: Married    Spouse Name: N/A    Number of Children: N/A  . Years of Education: N/A   Social History Main Topics  . Smoking status: Former Games developer  . Smokeless tobacco: Never Used  . Alcohol Use: None  . Drug Use: None  . Sexually Active: None   Other Topics Concern  . None   Social History Narrative  . None   Past Surgical History  Procedure Date  . Coronary artery bypass graft    Past Medical History  Diagnosis Date  . Ataxia, late effect of cerebrovascular disease   . Aphasia, late effect of cerebrovascular disease   . Cerebral thrombosis   . Stroke   . Coronary artery disease   . Hypertension   . Diabetes mellitus    BP 142/50  Pulse 64  Resp 14  Ht 5\' 11"  (1.803 m)  Wt 197 lb (89.359 kg)  BMI 27.48 kg/m2  SpO2 96%     Review of Systems  Musculoskeletal: Positive for gait problem.  All other systems reviewed and are negative.       Objective:   Physical Exam  Musculoskeletal:  Left ankle: He exhibits decreased range of motion and swelling. He exhibits no ecchymosis and no deformity. no tenderness. Achilles tendon normal.  Neurological: A sensory deficit is present. He exhibits abnormal muscle tone. Gait abnormal.  Left side sensory deficit Motor strength in the left lower extremity is 4 minus quad 4 at the ankle dorsiflexor and plantar flexor 3 at the foot evertor Increased equina varus spasticity Inversion of the left foot with standing with hyperextension        Assessment & Plan:  1. 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 He has good ankle dorsiflexion and fair ankle in eversion however he does have inversion at the ankle during standing. He will benefit from ankle splint which provides medial lateral stability but still allows active and passive ankle dorsiflexion and plantar flexion  In terms of his knee, hyperextension during standing is a problem.Knee orthosis may be helpful These orthoses have been ordered and the patient is planning to pick them up today He'll get additional training with these and then I will see him in 6 weeks

## 2012-06-19 ENCOUNTER — Encounter: Payer: Self-pay | Admitting: Physical Medicine & Rehabilitation

## 2012-06-19 ENCOUNTER — Encounter: Payer: Medicare Other | Attending: Physical Medicine & Rehabilitation

## 2012-06-19 ENCOUNTER — Ambulatory Visit (HOSPITAL_BASED_OUTPATIENT_CLINIC_OR_DEPARTMENT_OTHER): Payer: Medicare Other | Admitting: Physical Medicine & Rehabilitation

## 2012-06-19 VITALS — BP 168/56 | HR 60 | Resp 14 | Ht 70.0 in | Wt 192.6 lb

## 2012-06-19 DIAGNOSIS — I69959 Hemiplegia and hemiparesis following unspecified cerebrovascular disease affecting unspecified side: Secondary | ICD-10-CM | POA: Insufficient documentation

## 2012-06-19 DIAGNOSIS — M25569 Pain in unspecified knee: Secondary | ICD-10-CM

## 2012-06-19 DIAGNOSIS — M25561 Pain in right knee: Secondary | ICD-10-CM

## 2012-06-19 DIAGNOSIS — I69993 Ataxia following unspecified cerebrovascular disease: Secondary | ICD-10-CM | POA: Insufficient documentation

## 2012-06-19 DIAGNOSIS — G8110 Spastic hemiplegia affecting unspecified side: Secondary | ICD-10-CM

## 2012-06-19 NOTE — Patient Instructions (Signed)
Will check knee x-rays of the right knee. If it shows arthritis we may order a brace to support I'll check you in about one month to go over this

## 2012-06-19 NOTE — Progress Notes (Signed)
Subjective:    Patient ID: THANE AGE, male    DOB: 09/11/1945, 66 y.o.   MRN: [id number]  HPI Started on Vit D by Dr Bonney Roussel a couple times due to R knee giving away Tried L knee brace wasn't helpful Pain Inventory Average Pain 8 Pain Right Now 8 My pain is aching  In the last 24 hours, has pain interfered with the following? General activity 5 Relation with others 5 Enjoyment of life 5 What TIME of day is your pain at its worst? morning daytime and evening Sleep (in general) Good  Pain is worse with: walking and standing Pain improves with: rest, heat/ice and medication Relief from Meds: 9  Mobility walk with assistance use a walker ability to climb steps?  no do you drive?  yes use a wheelchair  Function disabled: date disabled 1999  Neuro/Psych weakness trouble walking  Prior Studies Any changes since last visit?  no  Physicians involved in your care Any changes since last visit?  no   Family History  Problem Relation Age of Onset  . Heart disease Father    History   Social History  . Marital Status: Married    Spouse Name: N/A    Number of Children: N/A  . Years of Education: N/A   Social History Main Topics  . Smoking status: Former Games developer  . Smokeless tobacco: Never Used  . Alcohol Use: None  . Drug Use: None  . Sexually Active: None   Other Topics Concern  . None   Social History Narrative  . None   Past Surgical History  Procedure Date  . Coronary artery bypass graft    Past Medical History  Diagnosis Date  . Ataxia, late effect of cerebrovascular disease   . Aphasia, late effect of cerebrovascular disease   . Cerebral thrombosis   . Stroke   . Coronary artery disease   . Hypertension   . Diabetes mellitus    BP 168/56  Pulse 60  Resp 14  Ht 5\' 10"  (1.778 m)  Wt 192 lb 9.6 oz (87.363 kg)  BMI 27.64 kg/m2  SpO2 100%   Review of Systems  Constitutional: Positive for unexpected weight change.   Musculoskeletal: Positive for gait problem.  Neurological: Positive for weakness.  All other systems reviewed and are negative.       Objective:   Physical Exam  Nursing note and vitals reviewed. Constitutional: He is oriented to person, place, and time.  Musculoskeletal:       Right knee: Normal.  Neurological: He is alert and oriented to person, place, and time.    Musculoskeletal:  Left ankle: He exhibits decreased range of motion and swelling. He exhibits no ecchymosis and no deformity. no tenderness. Achilles tendon normal.  Neurological: A sensory deficit is present. He exhibits abnormal muscle tone. Gait abnormal.  Left side sensory deficit Motor strength in the left lower extremity is 4 minus quad 4 at the ankle dorsiflexor and plantar flexor 3 at the foot evertor Increased equina varus spasticity Inversion of the left foot with standing with hyperextension        Assessment & Plan:  1. 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 He has good ankle dorsiflexion and fair ankle in eversion however he does have inversion at the ankle during standing. He will benefit from ankle splint which provides medial lateral stability but still allows active and passive ankle dorsiflexion and plantar flexion  2. Right knee gives away. Exam is  unremarkable. Question where the may be underlying arthritis. Check x-rays and return to clinic in one month

## 2012-06-23 ENCOUNTER — Ambulatory Visit
Admission: RE | Admit: 2012-06-23 | Discharge: 2012-06-23 | Disposition: A | Discharge status: Home / Self Care | Payer: Medicare Other | Source: Ambulatory Visit | Attending: Physical Medicine & Rehabilitation | Admitting: Physical Medicine & Rehabilitation

## 2012-06-23 DIAGNOSIS — M25561 Pain in right knee: Secondary | ICD-10-CM

## 2012-07-21 ENCOUNTER — Encounter: Payer: Medicare Other | Attending: Physical Medicine & Rehabilitation

## 2012-07-21 ENCOUNTER — Ambulatory Visit (HOSPITAL_BASED_OUTPATIENT_CLINIC_OR_DEPARTMENT_OTHER): Payer: Medicare Other | Admitting: Physical Medicine & Rehabilitation

## 2012-07-21 ENCOUNTER — Encounter: Payer: Self-pay | Admitting: Physical Medicine & Rehabilitation

## 2012-07-21 VITALS — BP 139/53 | HR 66 | Resp 14 | Ht 71.0 in | Wt 193.0 lb

## 2012-07-21 DIAGNOSIS — I69993 Ataxia following unspecified cerebrovascular disease: Secondary | ICD-10-CM | POA: Insufficient documentation

## 2012-07-21 DIAGNOSIS — I69959 Hemiplegia and hemiparesis following unspecified cerebrovascular disease affecting unspecified side: Secondary | ICD-10-CM | POA: Insufficient documentation

## 2012-07-21 MED ORDER — DICLOFENAC SODIUM 1 % TD GEL: 2.0000 g | Freq: Four times a day (QID) | TRANSDERMAL | Status: DC

## 2012-07-21 NOTE — Patient Instructions (Signed)
Diclofenac skin gel What is this medicine? DICLOFENAC (dye KLOE fen ak) is a non-steroidal anti-inflammatory drug (NSAID). The 1% skin gel is used to treat osteoarthritis of the hands or knees. The 3% skin gel is used to treat actinic keratosis. This medicine may be used for other purposes; ask your health care provider or pharmacist if you have questions. What should I tell my health care provider before I take this medicine? They need to know if you have any of these conditions: -asthma -bleeding problems -coronary artery bypass graft (CABG) surgery within the past 2 weeks -heart disease -high blood pressure -if you frequently drink alcohol containing drinks -kidney disease -liver disease -open or infected skin -stomach problems -an unusual or allergic reaction to diclofenac, aspirin, other NSAIDs, other medicines, benzyl alcohol (3% gel only), foods, dyes, or preservatives -pregnant or trying to get pregnant -breast-feeding How should I use this medicine? This medicine is for external use only. Follow the directions on the prescription label. Wash hands before and after use. Do not get this medicine in your eyes. If you do, rinse out with plenty of cool tap water. Use your doses at regular intervals. Do not use your medicine more often than directed. A special MedGuide will be given to you by the pharmacist with each prescription and refill of the 1% gel. Be sure to read this information carefully each time. Talk to your pediatrician regarding the use of this medicine in children. Special care may be needed. The 3% gel is not approved for use in children. Overdosage: If you think you have taken too much of this medicine contact a poison control center or emergency room at once. NOTE: This medicine is only for you. Do not share this medicine with others. What if I miss a dose? If you miss a dose, use it as soon as you can. If it is almost time for your next dose, use only that dose. Do  not use double or extra doses. What may interact with this medicine? -aspirin -NSAIDs, medicines for pain and inflammation, like ibuprofen or naproxen Do not use any other skin products without telling your doctor or health care professional. This list may not describe all possible interactions. Give your health care provider a list of all the medicines, herbs, non-prescription drugs, or dietary supplements you use. Also tell them if you smoke, drink alcohol, or use illegal drugs. Some items may interact with your medicine. What should I watch for while using this medicine? Tell your doctor or healthcare professional if your symptoms do not start to get better or if they get worse. You will need to follow up with your health care provider to monitor your progress. You may need to be treated for up to 3 months if you are using the 3% gel, but the full effect may not occur until 1 month after stopping treatment. If you develop a severe skin reaction, contact your doctor or health care professional immediately. This medicine can make you more sensitive to the sun. Keep out of the sun. If you cannot avoid being in the sun, wear protective clothing and use sunscreen. Do not use sun lamps or tanning beds/booths. Do not take medicines such as ibuprofen and naproxen with this medicine. Side effects such as stomach upset, nausea, or ulcers may be more likely to occur. Many medicines available without a prescription should not be taken with this medicine. This medicine does not prevent heart attack or stroke. In fact, this medicine may increase  the chance of a heart attack or stroke. The chance may increase with longer use of this medicine and in people who have heart disease. If you take aspirin to prevent heart attack or stroke, talk with your doctor or health care professional. This medicine can cause ulcers and bleeding in the stomach and intestines at any time during treatment. Do not smoke cigarettes or drink  alcohol. These increase irritation to your stomach and can make it more susceptible to damage from this medicine. Ulcers and bleeding can happen without warning symptoms and can cause death. You may get drowsy or dizzy. Do not drive, use machinery, or do anything that needs mental alertness until you know how this medicine affects you. Do not stand or sit up quickly, especially if you are an older patient. This reduces the risk of dizzy or fainting spells. This medicine can cause you to bleed more easily. Try to avoid damage to your teeth and gums when you brush or floss your teeth. What side effects may I notice from receiving this medicine? Side effects that you should report to your doctor or health care professional as soon as possible: -allergic reactions like skin rash, itching or hives, swelling of the face, lips, or tongue -black or bloody stools, blood in the urine or vomit -blurred vision -chest pain -difficulty breathing or wheezing -nausea or vomiting -redness, blistering, peeling or loosening of the skin, including inside the mouth -slurred speech or weakness on one side of the body -trouble passing urine or change in the amount of urine -unexplained weight gain or swelling -unusually weak or tired -yellowing of eyes or skin Side effects that usually do not require medical attention (report to your doctor or health care professional if they continue or are bothersome): -dizziness -dry skin -headache -heartburn -increased sensitivity to the sun -stomach pain -tingling at the application site This list may not describe all possible side effects. Call your doctor for medical advice about side effects. You may report side effects to FDA at 1-800-FDA-1088. Where should I keep my medicine? Keep out of the reach of children. Store the 1% gel at room temperature between 15 and 30 degrees C (59 and 86 degrees F). Store the 3% gel at room temperature between 20 and 25 degrees C (68 and  77 degrees F). Protect from light. Throw away any unused medicine after the expiration date. NOTE: This sheet is a summary. It may not cover all possible information. If you have questions about this medicine, talk to your doctor, pharmacist, or health care provider.  2012, [employer]vier/[employer]. (10/26/2007 4:35:07 PM)

## 2012-07-21 NOTE — Progress Notes (Signed)
Subjective:    Patient ID: Roger Berger, male    DOB: July 22, 1945, 67 y.o.   MRN: [id number]  HPI Tried ankle brace which has not helpful. AFO is actually the most helpful. No new medical issues. Knee pain right side occurs 2-3 times per day. Pain Inventory Average Pain 5 Pain Right Now 5 My pain is sharp  In the last 24 hours, has pain interfered with the following? General activity 6 Relation with others 2 Enjoyment of life 6 What TIME of day is your pain at its worst? daytime Sleep (in general) Good  Pain is worse with: walking, bending and standing Pain improves with: medication Relief from Meds: 7  Mobility walk with assistance use a walker how many minutes can you walk? dont know ability to climb steps?  no do you drive?  yes use a wheelchair transfers alone  Function disabled: date disabled 25 Do you have any goals in this area?  no  Neuro/Psych bladder control problems trouble walking  Prior Studies Any changes since last visit?  no  Physicians involved in your care Any changes since last visit?  no   Family History  Problem Relation Age of Onset  . Heart disease Father    History   Social History  . Marital Status: Married    Spouse Name: N/A    Number of Children: N/A  . Years of Education: N/A   Social History Main Topics  . Smoking status: Former Games developer  . Smokeless tobacco: Never Used  . Alcohol Use: None  . Drug Use: None  . Sexually Active: None   Other Topics Concern  . None   Social History Narrative  . None   Past Surgical History  Procedure Date  . Coronary artery bypass graft    Past Medical History  Diagnosis Date  . Ataxia, late effect of cerebrovascular disease   . Aphasia, late effect of cerebrovascular disease   . Cerebral thrombosis   . Stroke   . Coronary artery disease   . Hypertension   . Diabetes mellitus    BP 139/53  Pulse 66  Resp 14  Ht 5\' 11"  (1.803 m)  Wt 193 lb (87.544 kg)  BMI 26.92  kg/m2  SpO2 98%     Review of Systems  Cardiovascular: Positive for leg swelling.  Musculoskeletal: Positive for gait problem.  All other systems reviewed and are negative.       Objective:   Physical Exam  Nursing note and vitals reviewed. Constitutional: He is oriented to person, place, and time. He appears well-developed and well-nourished.  HENT:  Head: Normocephalic and atraumatic.  Eyes: Conjunctivae normal and EOM are normal. Pupils are equal, round, and reactive to light.  Musculoskeletal:       Right knee: He exhibits normal range of motion, no swelling, no effusion, no ecchymosis, no deformity, no LCL laxity and no MCL laxity. tenderness found. Patellar tendon tenderness noted. No medial joint line, no lateral joint line, no MCL and no LCL tenderness noted.  Neurological: He is alert and oriented to person, place, and time. Gait abnormal.       4/5 strength in the left deltoid, biceps, triceps, grip 5/5 in the right side Ataxia on finger-nose-finger testing on the left side  Psychiatric: He has a normal mood and affect.          Assessment & Plan:  1. 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 Has reached plateau and function. Will  recheck in 3 months. 2. Right knee pain reviewed x-rays. No degenerative changes. Suspect patellar tendon problem. Will trial diclofenac gel

## 2012-10-16 ENCOUNTER — Encounter: Payer: Medicare Other | Attending: Physical Medicine & Rehabilitation

## 2012-10-16 ENCOUNTER — Ambulatory Visit: Payer: Medicare Other | Admitting: Physical Medicine & Rehabilitation

## 2012-10-19 ENCOUNTER — Ambulatory Visit: Payer: Medicare Other | Admitting: Physical Medicine & Rehabilitation

## 2012-10-20 ENCOUNTER — Ambulatory Visit: Payer: Medicare Other | Admitting: Physical Medicine & Rehabilitation

## 2012-11-10 ENCOUNTER — Encounter: Payer: Self-pay | Admitting: Physical Medicine & Rehabilitation

## 2012-11-10 ENCOUNTER — Encounter: Payer: Medicare Other | Attending: Physical Medicine & Rehabilitation

## 2012-11-10 ENCOUNTER — Ambulatory Visit (HOSPITAL_BASED_OUTPATIENT_CLINIC_OR_DEPARTMENT_OTHER): Payer: Medicare Other | Admitting: Physical Medicine & Rehabilitation

## 2012-11-10 VITALS — BP 139/63 | HR 60 | Resp 14 | Ht 71.0 in | Wt 190.2 lb

## 2012-11-10 DIAGNOSIS — G8110 Spastic hemiplegia affecting unspecified side: Secondary | ICD-10-CM

## 2012-11-10 DIAGNOSIS — I69993 Ataxia following unspecified cerebrovascular disease: Secondary | ICD-10-CM | POA: Insufficient documentation

## 2012-11-10 DIAGNOSIS — M25569 Pain in unspecified knee: Secondary | ICD-10-CM | POA: Insufficient documentation

## 2012-11-10 NOTE — Progress Notes (Signed)
Subjective:    Patient ID: Roger Berger, male    DOB: 1946/03/07, 67 y.o.   MRN: [id number]  HPI Exercises at senior center on [address], 3 times a week, air dyne No Falls Knee pain improved, hasn't used diclofenac gel for a month Saw PCP in Feb , next visit in June Pain Inventory Average Pain 0 Pain Right Now 0 My pain is no pain  In the last 24 hours, has pain interfered with the following? General activity 0 Relation with others 0 Enjoyment of life 0 What TIME of day is your pain at its worst? no pain Sleep (in general) Good  Pain is worse with: no pain Pain improves with: rest and medication Relief from Meds: 9  Mobility walk with assistance use a walker ability to climb steps?  no do you drive?  yes  Function disabled: date disabled 28  Neuro/Psych weakness trouble walking  Prior Studies Any changes since last visit?  no  Physicians involved in your care Any changes since last visit?  no   Family History  Problem Relation Age of Onset  . Heart disease Father    History   Social History  . Marital Status: Married    Spouse Name: N/A    Number of Children: N/A  . Years of Education: N/A   Social History Main Topics  . Smoking status: Former Games developer  . Smokeless tobacco: Never Used  . Alcohol Use: None  . Drug Use: None  . Sexually Active: None   Other Topics Concern  . None   Social History Narrative  . None   Past Surgical History  Procedure Laterality Date  . Coronary artery bypass graft     Past Medical History  Diagnosis Date  . Ataxia, late effect of cerebrovascular disease   . Aphasia, late effect of cerebrovascular disease   . Cerebral thrombosis   . Stroke   . Coronary artery disease   . Hypertension   . Diabetes mellitus    BP 139/63  Pulse 60  Resp 14  Ht 5\' 11"  (1.803 m)  Wt 190 lb 3.2 oz (86.274 kg)  BMI 26.54 kg/m2  SpO2 98%    Review of Systems  Musculoskeletal: Positive for gait problem.   Neurological: Positive for weakness.  All other systems reviewed and are negative.       Objective:   Physical Exam Nursing note and vitals reviewed.  Constitutional: He is oriented to person, place, and time. He appears well-developed and well-nourished.  HENT:  Head: Normocephalic and atraumatic.  Eyes: Conjunctivae normal and EOM are normal. Pupils are equal, round, and reactive to light.  Musculoskeletal:  Right knee: He exhibits normal range of motion, no swelling, no effusion, no ecchymosis, no deformity, no LCL laxity and no MCL laxity.No Patellar tendon tenderness noted. No medial joint line, no lateral joint line, no MCL and no LCL tenderness noted.  Neurological: He is alert and oriented to person, place, and time. Gait abnormal.  4+/5 strength in the left deltoid, biceps, triceps, grip 5/5 in the right side Ataxia on finger-nose-finger testing on the left side  Psychiatric: He has a normal mood and affect.         Assessment & Plan:  1. 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 Has reached plateau and function.   2. Right knee pain reviewed x-rays. No degenerative changes. Suspect patellar tendon problem.Responded to diclofenac gel but has not used for 1 month RTC PRN

## 2012-12-31 ENCOUNTER — Encounter: Payer: Self-pay | Admitting: Internal Medicine

## 2012-12-31 ENCOUNTER — Other Ambulatory Visit: Payer: Self-pay | Admitting: Internal Medicine

## 2012-12-31 ENCOUNTER — Ambulatory Visit (INDEPENDENT_AMBULATORY_CARE_PROVIDER_SITE_OTHER): Payer: Medicare Other | Admitting: Internal Medicine

## 2012-12-31 VITALS — BP 144/68 | HR 64 | Ht 71.0 in | Wt 190.4 lb

## 2012-12-31 DIAGNOSIS — M1710 Unilateral primary osteoarthritis, unspecified knee: Secondary | ICD-10-CM

## 2012-12-31 DIAGNOSIS — M179 Osteoarthritis of knee, unspecified: Secondary | ICD-10-CM

## 2012-12-31 DIAGNOSIS — M216X9 Other acquired deformities of unspecified foot: Secondary | ICD-10-CM

## 2012-12-31 DIAGNOSIS — I779 Disorder of arteries and arterioles, unspecified: Secondary | ICD-10-CM

## 2012-12-31 DIAGNOSIS — I739 Peripheral vascular disease, unspecified: Secondary | ICD-10-CM | POA: Insufficient documentation

## 2012-12-31 DIAGNOSIS — M21372 Foot drop, left foot: Secondary | ICD-10-CM

## 2012-12-31 DIAGNOSIS — I10 Essential (primary) hypertension: Secondary | ICD-10-CM | POA: Insufficient documentation

## 2012-12-31 DIAGNOSIS — I6350 Cerebral infarction due to unspecified occlusion or stenosis of unspecified cerebral artery: Secondary | ICD-10-CM

## 2012-12-31 DIAGNOSIS — I639 Cerebral infarction, unspecified: Secondary | ICD-10-CM | POA: Insufficient documentation

## 2012-12-31 DIAGNOSIS — M21379 Foot drop, unspecified foot: Secondary | ICD-10-CM | POA: Insufficient documentation

## 2012-12-31 DIAGNOSIS — I2510 Atherosclerotic heart disease of native coronary artery without angina pectoris: Secondary | ICD-10-CM

## 2012-12-31 DIAGNOSIS — E785 Hyperlipidemia, unspecified: Secondary | ICD-10-CM | POA: Insufficient documentation

## 2012-12-31 DIAGNOSIS — I25810 Atherosclerosis of coronary artery bypass graft(s) without angina pectoris: Secondary | ICD-10-CM

## 2012-12-31 DIAGNOSIS — Z951 Presence of aortocoronary bypass graft: Secondary | ICD-10-CM

## 2012-12-31 HISTORY — DX: Foot drop, unspecified foot: M21.379

## 2012-12-31 HISTORY — DX: Osteoarthritis of knee, unspecified: M17.9

## 2012-12-31 HISTORY — DX: Disorder of arteries and arterioles, unspecified: I77.9

## 2012-12-31 HISTORY — DX: Unilateral primary osteoarthritis, unspecified knee: M17.10

## 2012-12-31 NOTE — Patient Instructions (Signed)
Follow-up in 6 months with PA/NP and 1 year with Dr. Rennis Golden.

## 2012-12-31 NOTE — Telephone Encounter (Signed)
Rx was sent to pharmacy electronically. 

## 2012-12-31 NOTE — Progress Notes (Signed)
OFFICE NOTE  Chief Complaint:  Routine followup  Primary Care Physician: Laurena Slimmer, MD  HPI:  Roger Berger is a 67 year old gentleman (retired Programmer, [employer]) with a history of coronary artery disease, status post CABG in 2000, also PAD with a left fem-pop which is occluded, and he has had no claudication symptoms and has remained on Plavix and Pletal. He has an occluded left internal carotid, also diabetes and a history of stroke and TIA. Recently he has been having ongoing problem with left foot drop as well as now new what sounds like knee arthritis and instability. He had a fall within the last month and has been followed by Dr. Wynn Banker for that. With regard to his cardiac symptoms, he has had no chest pain, worsening shortness of breath, palpitations, presyncope, or syncopal symptoms.  PMHx:  Past Medical History  Diagnosis Date  . Ataxia, late effect of cerebrovascular disease   . Aphasia, late effect of cerebrovascular disease   . Cerebral thrombosis   . Stroke   . Coronary artery disease   . Hypertension   . Diabetes mellitus     Past Surgical History  Procedure Laterality Date  . Coronary artery bypass graft      FAMHx:  Family History  Problem Relation Age of Onset  . Heart disease Father     SOCHx:   reports that he has quit smoking. He has never used smokeless tobacco. He reports that he does not drink alcohol. His drug history is not on file.  ALLERGIES:  No Known Allergies  ROS: A comprehensive review of systems was negative except for: Musculoskeletal: positive for muscle weakness and stiff joints Neurological: positive for coordination problems and dizziness  HOME MEDS: Current Outpatient Prescriptions  Medication Sig Dispense Refill  . amLODipine (NORVASC) 10 MG tablet Take 10 mg by mouth daily.      Marland Kitchen aspirin 81 MG tablet Take 160 mg by mouth daily.      . cetirizine (ZYRTEC) 10 MG tablet Take 10 mg by mouth daily.      . cilostazol  (PLETAL) 100 MG tablet Take 100 mg by mouth 2 (two) times daily.      . clopidogrel (PLAVIX) 75 MG tablet Take 1 tablet by mouth Daily.      . furosemide (LASIX) 40 MG tablet Take 40 mg by mouth daily.      . hydrALAZINE (APRESOLINE) 25 MG tablet Take 25 mg by mouth 3 (three) times daily.      . insulin NPH-regular (NOVOLIN 70/30) (70-30) 100 UNIT/ML injection Inject 100 Units into the skin. Sliding scale      . metFORMIN (GLUCOPHAGE) 500 MG tablet Take 500 mg by mouth 2 (two) times daily with a meal.      . metoprolol (LOPRESSOR) 100 MG tablet Take 100 mg by mouth daily.      . pravastatin (PRAVACHOL) 40 MG tablet Take 40 mg by mouth daily.      . ramipril (ALTACE) 10 MG tablet Take 10 mg by mouth 2 (two) times daily.       . ranitidine (ZANTAC) 75 MG tablet Take 75 mg by mouth 2 (two) times daily.      Marland Kitchen senna (SENOKOT) 8.6 MG tablet Take 1 tablet by mouth daily.      . sitaGLIPtin (JANUVIA) 100 MG tablet Take 100 mg by mouth daily.      . Vitamin D, Ergocalciferol, (DRISDOL) 50000 UNITS CAPS Take 50,000 Units by mouth 2 (two) times a  week.      . diclofenac sodium (VOLTAREN) 1 % GEL Apply 2 g topically 4 (four) times daily.  3 Tube  2   No current facility-administered medications for this visit.    LABS/IMAGING: No results found for this or any previous visit (from the past 48 hour(s)). No results found.  VITALS: BP 144/68  Pulse 64  Ht 5\' 11"  (1.803 m)  Wt 190 lb 6.4 oz (86.365 kg)  BMI 26.57 kg/m2  EXAM: General appearance: alert and no distress Neck: no adenopathy, no carotid bruit, no JVD, supple, symmetrical, trachea midline and thyroid not enlarged, symmetric, no tenderness/mass/nodules Lungs: clear to auscultation bilaterally Heart: regular rate and rhythm, S1, S2 normal, no murmur, click, rub or gallop Abdomen: soft, non-tender; bowel sounds normal; no masses,  no organomegaly Extremities: extremities normal, atraumatic, there is trace to 1+ LLE edema, a foot drop brace  is in place Pulses: 2+ and symmetric Skin: Skin color, texture, turgor normal. No rashes or lesions Neurologic: Mental status: Alert, oriented, thought content appropriate, Decreased strength and tone to the left lower extremity  EKG: Normal sinus rhythm at 64  ASSESSMENT: 1. Coronary artery disease status post 5 vessel CABG in 2000, with 2 patent arterial grafts and one patent vein graft 2. Extensive PAD status post bilateral femoropopliteal with occluded left femoral popliteal graft 3. Hypertension 4. Dyslipidemia 5. History of stroke x3 6. Diabetes type 2 7. Knee osteoarthritis 8. Internal carotid artery disease bilaterally with occluded left internal carotid 9. Diastolic dysfunction EF 50-55%  PLAN: 1.   Roger Berger has multiple cardiovascular problems but is fairly stable from a symptomatic standpoint. His blood pressure is fairly well controlled and he is not complaining of any symptoms of claudication when he walks. 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 He's been followed by [facility] for this. He recently had laboratory work performed at Dr. Ophelia Charter office which showed a total cholesterol of 150. His blood sugar remains elevated with a glucose in the urine of over 1000. Despite this, his A1c is 6.3. Cardiovascular standpoint I feel he is doing fairly well. We will plan to see him back annually with me, and at their request every 6 months with the nurse practitioner.  Chrystie Nose, MD, [facility] Attending Cardiologist [facility]  HILTY,Kenneth C 12/31/2012, 12:15 PM

## 2013-01-01 ENCOUNTER — Encounter: Payer: Self-pay | Admitting: Internal Medicine

## 2013-02-18 ENCOUNTER — Encounter (INDEPENDENT_AMBULATORY_CARE_PROVIDER_SITE_OTHER): Payer: Self-pay | Admitting: Ophthalmology

## 2013-03-09 ENCOUNTER — Encounter (INDEPENDENT_AMBULATORY_CARE_PROVIDER_SITE_OTHER): Payer: Self-pay | Admitting: Ophthalmology

## 2013-05-14 ENCOUNTER — Encounter: Payer: Self-pay | Admitting: Physical Medicine & Rehabilitation

## 2013-05-14 ENCOUNTER — Encounter: Payer: Medicare Other | Attending: Physical Medicine & Rehabilitation

## 2013-05-14 ENCOUNTER — Ambulatory Visit (HOSPITAL_BASED_OUTPATIENT_CLINIC_OR_DEPARTMENT_OTHER): Payer: Medicare Other | Admitting: Physical Medicine & Rehabilitation

## 2013-05-14 VITALS — BP 158/70 | HR 62 | Resp 14 | Ht 71.0 in | Wt 190.0 lb

## 2013-05-14 DIAGNOSIS — I69993 Ataxia following unspecified cerebrovascular disease: Secondary | ICD-10-CM

## 2013-05-14 DIAGNOSIS — G8110 Spastic hemiplegia affecting unspecified side: Secondary | ICD-10-CM

## 2013-05-14 DIAGNOSIS — M25569 Pain in unspecified knee: Secondary | ICD-10-CM | POA: Insufficient documentation

## 2013-05-14 NOTE — Patient Instructions (Signed)
  67 year old male status post left paramedian pontine infarct onset on   May 04, 2010, with slurred speech and increase in left-sided   weakness.  He had prior CVAs causing left hemiparesis in the past.  In   fact, he has undergone vertebral basilar stenting in the past and has   had a chronic left foot drop.  He was admitted to the inpatient   rehabilitation unit on May 08, 2010, and stayed there until May 21, 2010.  He has been going to outpatient therapy at [facility].  He has seen his primary care physician, Dr. Margaretmary Bayley as   well as his cardiologist.  He has also received then AFO while he was on   inpatient rehab unit.  He has a prior history of left ICA occlusion as   well.

## 2013-05-14 NOTE — Progress Notes (Signed)
Subjective:    Patient ID: Roger Berger, male    DOB: 17-Oct-1945, 67 y.o.   MRN: [id number]  67 year old male status post left paramedian pontine infarct onset on   May 04, 2010, with slurred speech and increase in left-sided   weakness.  He had prior CVAs causing left hemiparesis in the past.  In   fact, he has undergone vertebral basilar stenting in the past and has   had a chronic left foot drop.  He was admitted to the inpatient   rehabilitation unit on May 08, 2010, and stayed there until May 21, 2010. HPI Exercises at senior center on [place], 3 times a week, air dyne  3  Falls this month using walker, Falls backwards on floor.  No injury. Knee pain improved, occ diclofenac gel  No dizziness  No new arm weakness or swallowing problem No new pains Saw PCP in Feb , next visit in June  Dressing and bathing himself. Ambulates with a walker. Wife is at home with him.  Pain Inventory Average Pain 0 Pain Right Now 0 My pain is aching  In the last 24 hours, has pain interfered with the following? General activity 4 Relation with others 0 Enjoyment of life 4 What TIME of day is your pain at its worst? morning Sleep (in general) Good  Pain is worse with: walking, bending, sitting, inactivity, standing and some activites Pain improves with: medication Relief from Meds: 8  Mobility walk with assistance use a walker ability to climb steps?  no do you drive?  no use a wheelchair transfers alone  Function disabled: date disabled 32 I need assistance with the following:  shopping  Neuro/Psych weakness trouble walking  Prior Studies Any changes since last visit?  no  Physicians involved in your care Any changes since last visit?  no   Family History  Problem Relation Age of Onset  . Heart disease Father    History   Social History  . Marital Status: Married    Spouse Name: N/A    Number of Children: N/A  . Years of Education: N/A    Social History Main Topics  . Smoking status: Former Games developer  . Smokeless tobacco: Never Used     Comment: quit about 30 years ago.  . Alcohol Use: No  . Drug Use: None  . Sexual Activity: None   Other Topics Concern  . None   Social History Narrative  . None   Past Surgical History  Procedure Laterality Date  . Coronary artery bypass graft     Past Medical History  Diagnosis Date  . Ataxia, late effect of cerebrovascular disease   . Aphasia, late effect of cerebrovascular disease   . Cerebral thrombosis   . Stroke   . Coronary artery disease   . Hypertension   . Diabetes mellitus    BP 158/70  Pulse 62  Resp 14  Ht 5\' 11"  (1.803 m)  Wt 190 lb (86.183 kg)  BMI 26.51 kg/m2  SpO2 97%     Review of Systems  Musculoskeletal: Positive for gait problem.  Neurological: Positive for weakness.  All other systems reviewed and are negative.       Objective:   Physical Exam  Eyes: Conjunctivae normal and EOM are normal. Pupils are equal, round, and reactive to light.  Musculoskeletal:  Right knee: He exhibits normal range of motion, no swelling, no effusion, no ecchymosis, no deformity, no LCL laxity and no MCL laxity.No Patellar  tendon tenderness noted. No medial joint line, no lateral joint line, no MCL and no LCL tenderness noted.  Neurological: He is alert and oriented to person, place, and time. Gait abnormal.  4+/5 strength in the left deltoid, biceps, triceps, grip 5/5 in the right side 5/5 right hip flexor knee extensor ankle dorsi flexion plantar flexor  3-/5 left ankle dorsiflexor  Ataxia on finger-nose-finger testing on the left side  Psychiatric: He has a normal mood and affect.        Assessment & Plan:  1. 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 Has reached plateau and function. Increasing falls but no new neurologic deficits. I do not think any further imaging studies are needed. Son is feeling overwhelmed by  worry regarding his father. He is pursuing skilled nursing placement. Primary care has filled out and felt to form. I filled out for questions which were asked by [facility] 2. Right knee pain improved RTC PRN

## 2013-05-18 ENCOUNTER — Non-Acute Institutional Stay (SKILLED_NURSING_FACILITY): Payer: Medicare Other | Admitting: Internal Medicine

## 2013-05-18 DIAGNOSIS — I2510 Atherosclerotic heart disease of native coronary artery without angina pectoris: Secondary | ICD-10-CM

## 2013-05-18 DIAGNOSIS — I69959 Hemiplegia and hemiparesis following unspecified cerebrovascular disease affecting unspecified side: Secondary | ICD-10-CM

## 2013-05-18 DIAGNOSIS — E1159 Type 2 diabetes mellitus with other circulatory complications: Secondary | ICD-10-CM

## 2013-05-18 NOTE — Progress Notes (Signed)
Patient ID: Roger Berger, male   DOB: 02-02-1946, 67 y.o.   MRN: [id number] Facility; [facility] Chief complaint; admission to facility post transfer from home History; this is a man who suffered a right pontine stroke in 2011. He has been going to outpatient therapy at [facility] neural rehabilitation. The note I have is a rehabilitation note from November 7 listing his medication. I gather he was living at home with care from a son. I don't have a good sense of his functional status although the patient tells me he is unable to walk for many years.  Past Medical History  Diagnosis Date  . Ataxia, late effect of cerebrovascular disease   . Aphasia, late effect of cerebrovascular disease   . Cerebral thrombosis   . Stroke   . Coronary artery disease   . Hypertension   . Diabetes mellitus     Past Surgical History  Procedure Laterality Date  . Coronary artery bypass graft     Current Outpatient Prescriptions on File Prior to Visit  Medication Sig Dispense Refill  . amLODipine (NORVASC) 10 MG tablet Take 10 mg by mouth daily.      Marland Kitchen aspirin 81 MG tablet Take 160 mg by mouth daily.      . cetirizine (ZYRTEC) 10 MG tablet Take 10 mg by mouth daily.      . cilostazol (PLETAL) 100 MG tablet Take 100 mg by mouth 2 (two) times daily.      . clopidogrel (PLAVIX) 75 MG tablet Take 1 tablet by mouth Daily.      . diclofenac sodium (VOLTAREN) 1 % GEL Apply 2 g topically 4 (four) times daily.  3 Tube  2  . furosemide (LASIX) 40 MG tablet Take 40 mg by mouth daily.      . hydrALAZINE (APRESOLINE) 25 MG tablet Take 25 mg by mouth 3 (three) times daily.      . insulin NPH-regular (NOVOLIN 70/30) (70-30) 100 UNIT/ML injection Inject 100 Units into the skin. Sliding scale      . metFORMIN (GLUCOPHAGE) 500 MG tablet Take 500 mg by mouth 2 (two) times daily with a meal.      . metoprolol (LOPRESSOR) 100 MG tablet Take 100 mg by mouth daily.      Marland Kitchen oxybutynin (DITROPAN-XL) 5 MG 24 hr tablet        . pravastatin (PRAVACHOL) 40 MG tablet Take 40 mg by mouth daily.      . ramipril (ALTACE) 10 MG capsule TAKE ONE CAPSULE BY MOUTH TWICE DAILY  60 capsule  11  . ranitidine (ZANTAC) 75 MG tablet Take 75 mg by mouth 2 (two) times daily.      Marland Kitchen senna (SENOKOT) 8.6 MG tablet Take 1 tablet by mouth daily.      . sitaGLIPtin (JANUVIA) 100 MG tablet Take 100 mg by mouth daily.      . tamsulosin (FLOMAX) 0.4 MG CAPS capsule       . Vitamin D, Ergocalciferol, (DRISDOL) 50000 UNITS CAPS Take 50,000 Units by mouth 2 (two) times a week.           reports that he has quit smoking. He has never used smokeless tobacco. He reports that he does not drink alcohol.  family history includes Heart disease in his father.   Review of system HEENT; no diplopia Respiratory no cough no sputum Cardiac no complaints of chest pain or exertional shortness of breath GI; no dysphasia no abdominal pain no diarrhea GU  dysuria is continent status is unclear  Physical examination Respiratory clear entry bilaterally Cardiac heart sounds are normal no murmurs no carotid bruits Abdomen no liver no spleen no tenderness GU bladder is not enlarged no costovertebral angle tenderness Neurologic; surprisingly his cranial nerves are reasonably intact, he has mild left-sided weakness in the arm and leg at 4/5, more problematically he has what appears to be cerebellar-type tremors in the left arm. Mental status; disorientated to the year and month and place her. 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 Seems to fit with his bedside exam which would suggest interruption of the connections with the cerebellum. #2 gait ataxia secondary to #1 patient states he has not walked in 3-4 years able to transfer bed to wheelchair. Not really certain of his  functional status and whether this has changed recently #3 type 2 diabetes on insulin and metformin and [place]. He has a 70/30 NovoLog sliding scale which he takes twice a day #4 hypertension #5 BPH on Flomax #6 hyperlipidemia #7 coronary artery disease status post CABG. There is no evidence that this is active. Has followed with cardiology  The patient has done well since his original stroke and appears to be stable. He also has left internal carotid artery occlusion nevertheless he appears to have done well on Plavix and aspirin 81 mg. We'll update his lab work. The patient is here for long-term care. Whether there has been no change in his functional or neurologic status recently I am uncertain

## 2013-07-03 ENCOUNTER — Emergency Department (HOSPITAL_COMMUNITY): Payer: Medicare Other

## 2013-07-03 ENCOUNTER — Encounter (HOSPITAL_COMMUNITY): Payer: Self-pay | Admitting: Emergency Medicine

## 2013-07-03 ENCOUNTER — Emergency Department (HOSPITAL_COMMUNITY)
Admission: EM | Admit: 2013-07-03 | Discharge: 2013-07-03 | Disposition: A | Discharge status: Home / Self Care | Payer: Medicare Other | Attending: Emergency Medicine | Admitting: Emergency Medicine

## 2013-07-03 DIAGNOSIS — I69993 Ataxia following unspecified cerebrovascular disease: Secondary | ICD-10-CM | POA: Insufficient documentation

## 2013-07-03 DIAGNOSIS — Z87891 Personal history of nicotine dependence: Secondary | ICD-10-CM | POA: Insufficient documentation

## 2013-07-03 DIAGNOSIS — Z79899 Other long term (current) drug therapy: Secondary | ICD-10-CM | POA: Insufficient documentation

## 2013-07-03 DIAGNOSIS — R509 Fever, unspecified: Secondary | ICD-10-CM | POA: Insufficient documentation

## 2013-07-03 DIAGNOSIS — I2510 Atherosclerotic heart disease of native coronary artery without angina pectoris: Secondary | ICD-10-CM | POA: Insufficient documentation

## 2013-07-03 DIAGNOSIS — Z794 Long term (current) use of insulin: Secondary | ICD-10-CM | POA: Insufficient documentation

## 2013-07-03 DIAGNOSIS — I69920 Aphasia following unspecified cerebrovascular disease: Secondary | ICD-10-CM | POA: Insufficient documentation

## 2013-07-03 DIAGNOSIS — W19XXXA Unspecified fall, initial encounter: Secondary | ICD-10-CM | POA: Insufficient documentation

## 2013-07-03 DIAGNOSIS — E119 Type 2 diabetes mellitus without complications: Secondary | ICD-10-CM | POA: Insufficient documentation

## 2013-07-03 DIAGNOSIS — R112 Nausea with vomiting, unspecified: Secondary | ICD-10-CM | POA: Insufficient documentation

## 2013-07-03 DIAGNOSIS — Y9210 Unspecified residential institution as the place of occurrence of the external cause: Secondary | ICD-10-CM | POA: Insufficient documentation

## 2013-07-03 DIAGNOSIS — I10 Essential (primary) hypertension: Secondary | ICD-10-CM | POA: Insufficient documentation

## 2013-07-03 DIAGNOSIS — Z7982 Long term (current) use of aspirin: Secondary | ICD-10-CM | POA: Insufficient documentation

## 2013-07-03 DIAGNOSIS — N390 Urinary tract infection, site not specified: Secondary | ICD-10-CM | POA: Insufficient documentation

## 2013-07-03 DIAGNOSIS — Y939 Activity, unspecified: Secondary | ICD-10-CM | POA: Insufficient documentation

## 2013-07-03 DIAGNOSIS — Z7902 Long term (current) use of antithrombotics/antiplatelets: Secondary | ICD-10-CM | POA: Insufficient documentation

## 2013-07-03 LAB — BASIC METABOLIC PANEL
BUN: 19 mg/dL (ref 6–23)
Calcium: 9.4 mg/dL (ref 8.4–10.5)
Chloride: 103 mEq/L (ref 96–112)
Creatinine, Ser: 1.23 mg/dL (ref 0.50–1.35)
GFR calc Af Amer: 67 mL/min — ABNORMAL LOW (ref >90)
GFR calc non Af Amer: 58 mL/min — ABNORMAL LOW (ref >90)

## 2013-07-03 LAB — URINALYSIS, ROUTINE W REFLEX MICROSCOPIC
Bilirubin Urine: NEGATIVE
Ketones, ur: 15 mg/dL — AB
Leukocytes, UA: NEGATIVE
Nitrite: POSITIVE — AB
Specific Gravity, Urine: 1.024 (ref 1.005–1.030)
Urobilinogen, UA: 0.2 mg/dL (ref 0.0–1.0)
pH: 5 (ref 5.0–8.0)

## 2013-07-03 LAB — CBC WITH DIFFERENTIAL/PLATELET
Basophils Absolute: 0 10*3/uL (ref 0.0–0.1)
Basophils Relative: 0 % (ref 0–1)
HCT: 36.8 % — ABNORMAL LOW (ref 39.0–52.0)
Lymphocytes Relative: 12 % (ref 12–46)
MCHC: 35.3 g/dL (ref 30.0–36.0)
Monocytes Absolute: 0.9 10*3/uL (ref 0.1–1.0)
Monocytes Relative: 9 % (ref 3–12)
Neutro Abs: 8.1 10*3/uL — ABNORMAL HIGH (ref 1.7–7.7)
Platelets: 184 10*3/uL (ref 150–400)
RBC: 3.9 MIL/uL — ABNORMAL LOW (ref 4.22–5.81)
RDW: 12.8 % (ref 11.5–15.5)
WBC: 10.3 10*3/uL (ref 4.0–10.5)

## 2013-07-03 LAB — URINE MICROSCOPIC-ADD ON

## 2013-07-03 MED ORDER — CEPHALEXIN 500 MG PO CAPS: 500.0000 mg | ORAL_CAPSULE | Freq: Three times a day (TID) | ORAL | Status: DC

## 2013-07-03 MED ORDER — ACETAMINOPHEN 325 MG PO TABS
650.0000 mg | ORAL_TABLET | Freq: Four times a day (QID) | ORAL | Status: DC | PRN
  Administered 2013-07-03: 650 mg via ORAL
  Filled 2013-07-03: qty 2

## 2013-07-03 MED ORDER — CEPHALEXIN 250 MG PO CAPS
500.0000 mg | ORAL_CAPSULE | Freq: Once | ORAL | Status: AC
  Administered 2013-07-03: 500 mg via ORAL
  Filled 2013-07-03: qty 2

## 2013-07-03 NOTE — ED Provider Notes (Signed)
CSN: [id number]     Arrival date & time 07/03/13  0303 History   First MD Initiated Contact with Patient 07/03/13 0411     Chief Complaint  Patient presents with  . Fever  . Fall   (Consider location/radiation/quality/duration/timing/severity/associated sxs/prior Treatment) HPI Patient is an elderly man with multiple chronic medical problems who is currently residing at a rehab facility recouping from a CVA. He presents with fever - Temp 100.32F. He is without complaints. Specifically, he denies cough, SOB, nasal congestion, sore throat, chest pain, abdominal pain. However, he does endorse nausea with 4 episodes of NBNB emesis in the past 4 hrs. He had a normal, well formed stool < 24 hrs ago and has been passing flatus. No skin changes or extremity pain.   Past Medical History  Diagnosis Date  . Ataxia, late effect of cerebrovascular disease   . Aphasia, late effect of cerebrovascular disease   . Cerebral thrombosis   . Stroke   . Coronary artery disease   . Hypertension   . Diabetes mellitus    Past Surgical History  Procedure Laterality Date  . Coronary artery bypass graft     Family History  Problem Relation Age of Onset  . Heart disease Father    History  Substance Use Topics  . Smoking status: Former Games developer  . Smokeless tobacco: Never Used     Comment: quit about 30 years ago.  . Alcohol Use: No    Review of Systems Ten point review of symptoms performed and is negative with the exception of symptoms noted above .  Allergies  Review of patient's allergies indicates no known allergies.  Home Medications   Current Outpatient Rx  Name  Route  Sig  Dispense  Refill  . acetaminophen (TYLENOL) 325 MG tablet   Oral   Take 650 mg by mouth every 6 (six) hours as needed for mild pain, fever or headache.         Marland Kitchen amLODipine (NORVASC) 10 MG tablet   Oral   Take 10 mg by mouth daily.         Marland Kitchen aspirin 81 MG chewable tablet   Oral   Chew 81 mg by mouth daily.        Marland Kitchen atorvastatin (LIPITOR) 10 MG tablet   Oral   Take 10 mg by mouth daily.         . cilostazol (PLETAL) 100 MG tablet   Oral   Take 100 mg by mouth daily.          . clopidogrel (PLAVIX) 75 MG tablet   Oral   Take 1 tablet by mouth Daily.         . diclofenac sodium (VOLTAREN) 1 % GEL   Topical   Apply 2 g topically 4 (four) times daily.   3 Tube   2   . furosemide (LASIX) 40 MG tablet   Oral   Take 40 mg by mouth daily.         . hydrALAZINE (APRESOLINE) 25 MG tablet   Oral   Take 25 mg by mouth 3 (three) times daily.         . insulin lispro (HUMALOG) 100 UNIT/ML injection   Subcutaneous   Inject 5 Units into the skin 4 (four) times daily -  before meals and at bedtime. For blood sugar over 150         . metFORMIN (GLUCOPHAGE) 500 MG tablet   Oral   Take 500 mg  by mouth 2 (two) times daily with a meal.         . metoprolol (LOPRESSOR) 100 MG tablet   Oral   Take 100 mg by mouth daily.         Marland Kitchen oxybutynin (DITROPAN-XL) 5 MG 24 hr tablet               . ramipril (ALTACE) 10 MG capsule      TAKE ONE CAPSULE BY MOUTH TWICE DAILY   60 capsule   11   . ranitidine (ZANTAC) 150 MG tablet   Oral   Take 150 mg by mouth daily.         Marland Kitchen senna (SENOKOT) 8.6 MG tablet   Oral   Take 1 tablet by mouth daily.         . sitaGLIPtin (JANUVIA) 100 MG tablet   Oral   Take 100 mg by mouth daily.         . tamsulosin (FLOMAX) 0.4 MG CAPS capsule               . insulin NPH-regular (NOVOLIN 70/30) (70-30) 100 UNIT/ML injection   Subcutaneous   Inject 100 Units into the skin. Sliding scale         . Vitamin D, Ergocalciferol, (DRISDOL) 50000 UNITS CAPS   Oral   Take 50,000 Units by mouth 2 (two) times a week. Monday and Thursday          BP 141/70  Pulse 72  Temp(Src) 100.9 F (38.3 C) (Oral)  Resp 18  SpO2 99% Physical Exam Gen: well developed and well nourished appearing Head: NCAT Eyes: PERL, EOMI Nose: no  epistaixis or rhinorrhea Mouth/throat: mucosa is moist and pink Neck: supple, no stridor Lungs: CTA B, no wheezing, rhonchi or rales CV: RRR, no murmur, extremities appear well perfused.  Abd: soft, notender, nondistended Back: no ttp, no cva ttp Skin: warm and dry Ext: normal to inspection, no dependent edema Neuro: CN ii-xii grossly intact, no focal deficits Psyche; normal affect,  calm and cooperative.   ED Course  Procedures (including critical care time) Labs Review  Results for orders placed during the hospital encounter of 07/03/13 (from the past 24 hour(s))  BASIC METABOLIC PANEL     Status: Abnormal   Collection Time    07/03/13  4:03 AM      Result Value Range   Sodium 140  135 - 145 mEq/L   Potassium 3.9  3.5 - 5.1 mEq/L   Chloride 103  96 - 112 mEq/L   CO2 25  19 - 32 mEq/L   Glucose, Bld 198 (*) 70 - 99 mg/dL   BUN 19  6 - 23 mg/dL   Creatinine, Ser 1.61  0.50 - 1.35 mg/dL   Calcium 9.4  8.4 - 09.6 mg/dL   GFR calc non Af Amer 58 (*) >90 mL/min   GFR calc Af Amer 67 (*) >90 mL/min  CBC WITH DIFFERENTIAL     Status: Abnormal   Collection Time    07/03/13  4:03 AM      Result Value Range   WBC 10.3  4.0 - 10.5 K/uL   RBC 3.90 (*) 4.22 - 5.81 MIL/uL   Hemoglobin 13.0  13.0 - 17.0 g/dL   HCT 04.5 (*) 40.9 - 81.1 %   MCV 94.4  78.0 - 100.0 fL   MCH 33.3  26.0 - 34.0 pg   MCHC 35.3  30.0 - 36.0 g/dL   RDW  12.8  11.5 - 15.5 %   Platelets 184  150 - 400 K/uL   Neutrophils Relative % 79 (*) 43 - 77 %   Neutro Abs 8.1 (*) 1.7 - 7.7 K/uL   Lymphocytes Relative 12  12 - 46 %   Lymphs Abs 1.3  0.7 - 4.0 K/uL   Monocytes Relative 9  3 - 12 %   Monocytes Absolute 0.9  0.1 - 1.0 K/uL   Eosinophils Relative 0  0 - 5 %   Eosinophils Absolute 0.0  0.0 - 0.7 K/uL   Basophils Relative 0  0 - 1 %   Basophils Absolute 0.0  0.0 - 0.1 K/uL     Imaging Review Dg Chest 2 View  07/03/2013   CLINICAL DATA:  Status post fall; concern for chest injury.  Fever.  EXAM: CHEST   2 VIEW  COMPARISON:  Chest radiograph performed 01/01/2008  FINDINGS: The lungs are well-aerated and clear. There is no evidence of focal opacification, pleural effusion or pneumothorax.  The heart is borderline enlarged. The patient is status post median sternotomy, with evidence of prior CABG. No acute osseous abnormalities are seen.  IMPRESSION: No acute cardiopulmonary process seen; borderline cardiomegaly noted. No displaced rib fractures identified.   Electronically Signed   By: Roanna Raider M.D.   On: 07/03/2013 05:00     MDM  Patient with with simple UTI. We will send urine for culture. The patient has a normal WBC, is well hydrated and nontoxic appearing. First dose in ED. Patient is stable for d/c with plan for outpatient f/u and return precautions.     Brandt Loosen, MD 07/03/13 [id number]

## 2013-07-03 NOTE — ED Notes (Signed)
Pt requested ice water and RN J Cook oked. Pt given same

## 2013-07-03 NOTE — ED Notes (Signed)
Encouraged to void urine sample.

## 2013-07-03 NOTE — ED Notes (Signed)
Pt taken out in wheelchair pt being taken back to facility with his son.

## 2013-07-03 NOTE — ED Notes (Signed)
Pt speaking with son at [facility].

## 2013-07-03 NOTE — ED Notes (Addendum)
Pt A&Ox4, interactive, calm, NAD, skin W&D, resps e/u, speaking in clear complete sentences. Here by [facility] from [facility]. Pt's feet slid out from beneath him (socks on slippery floor), held onto rail and eased self down, "did not fall", "did not hit my head", pt (denies: pain, injury, complaints, sob, nvd, HA, dizziness or other sx). Sent here per rehab staff d/t concerned for fall was more than it was d/t fever noted and emesis onset at 0200. "thought he might have fallen for other reason and hit head, pt also urinated on self when vomiting, and incontinence is not usual for pt". Fever at facility was 101, no meds given, cbg for EMS PTA 179. Fever continues here. Vomited x1. Denies diarrhea. pt "thinks vomiting is r/t BBQ eaten earlier around 1830". Walks with walker.

## 2013-07-05 ENCOUNTER — Encounter: Payer: Self-pay | Admitting: Nurse Practitioner

## 2013-07-05 ENCOUNTER — Non-Acute Institutional Stay (SKILLED_NURSING_FACILITY): Payer: Medicare Other | Admitting: Nurse Practitioner

## 2013-07-05 DIAGNOSIS — I639 Cerebral infarction, unspecified: Secondary | ICD-10-CM

## 2013-07-05 DIAGNOSIS — I2510 Atherosclerotic heart disease of native coronary artery without angina pectoris: Secondary | ICD-10-CM

## 2013-07-05 DIAGNOSIS — E785 Hyperlipidemia, unspecified: Secondary | ICD-10-CM

## 2013-07-05 DIAGNOSIS — E1159 Type 2 diabetes mellitus with other circulatory complications: Secondary | ICD-10-CM | POA: Insufficient documentation

## 2013-07-05 DIAGNOSIS — I6350 Cerebral infarction due to unspecified occlusion or stenosis of unspecified cerebral artery: Secondary | ICD-10-CM

## 2013-07-05 DIAGNOSIS — I10 Essential (primary) hypertension: Secondary | ICD-10-CM

## 2013-07-05 DIAGNOSIS — I739 Peripheral vascular disease, unspecified: Secondary | ICD-10-CM

## 2013-07-05 DIAGNOSIS — G8110 Spastic hemiplegia affecting unspecified side: Secondary | ICD-10-CM

## 2013-07-05 DIAGNOSIS — N390 Urinary tract infection, site not specified: Secondary | ICD-10-CM

## 2013-07-05 LAB — URINE CULTURE

## 2013-07-05 NOTE — Progress Notes (Signed)
Patient ID: Roger Berger, male   DOB: 12/26/1945, 66 y.o.   MRN: [id number]    Nursing Home Location:  [facility]   Place of Service: SNF (31)  PCP: Laurena Slimmer, MD  No Known Allergies  Chief Complaint  Patient presents with  . Medical Managment of Chronic Issues    HPI:  67 year old male who has a pmh of right pontine stroke in 2011 with hemiplegia and ataxia, PAD, DM, OA, hyperlipidemia, HTN, CAD who is being seen today for routine follow up; pt was sent to the ED 2 days ago with fever and vomiting; pt was found to have a UTI and was placed on keflex; pt reports in the past 24 hours he has improved greatly and feels good; pt denies fever, chills, N/V or diarrhea, pt reports no urinary complaints hx or present; pt without complaints today and staff has no concerns.   Review of Systems:  Review of Systems  Constitutional: Negative for fever, chills and malaise/fatigue.  Respiratory: Negative for cough and shortness of breath.   Cardiovascular: Negative for chest pain, palpitations and leg swelling.  Gastrointestinal: Negative for heartburn, nausea, vomiting, abdominal pain, diarrhea and constipation.  Genitourinary: Negative for dysuria, urgency and frequency.  Musculoskeletal: Negative for myalgias.  Skin: Negative.   Neurological: Negative for dizziness, weakness and headaches.  Psychiatric/Behavioral: Negative for depression.     Past Medical History  Diagnosis Date  . Ataxia, late effect of cerebrovascular disease   . Aphasia, late effect of cerebrovascular disease   . Cerebral thrombosis   . Stroke   . Coronary artery disease   . Hypertension   . Diabetes mellitus    Past Surgical History  Procedure Laterality Date  . Coronary artery bypass graft     Social History:   reports that he has quit smoking. He has never used smokeless tobacco. He reports that he does not drink alcohol. His drug history is not on file.  Family History    Problem Relation Age of Onset  . Heart disease Father     Medications: Patient's Medications  New Prescriptions   No medications on file  Previous Medications   ACETAMINOPHEN (TYLENOL) 325 MG TABLET    Take 650 mg by mouth every 6 (six) hours as needed for mild pain, fever or headache.   AMLODIPINE (NORVASC) 10 MG TABLET    Take 10 mg by mouth daily.   ASPIRIN 81 MG CHEWABLE TABLET    Chew 81 mg by mouth daily.   ATORVASTATIN (LIPITOR) 10 MG TABLET    Take 10 mg by mouth daily.   CEPHALEXIN (KEFLEX) 500 MG CAPSULE    Take 1 capsule (500 mg total) by mouth 3 (three) times daily.   CILOSTAZOL (PLETAL) 100 MG TABLET    Take 100 mg by mouth daily.    CLOPIDOGREL (PLAVIX) 75 MG TABLET    Take 1 tablet by mouth Daily.   DICLOFENAC SODIUM (VOLTAREN) 1 % GEL    Apply 2 g topically 4 (four) times daily.   FUROSEMIDE (LASIX) 40 MG TABLET    Take 40 mg by mouth daily.   HYDRALAZINE (APRESOLINE) 25 MG TABLET    Take 25 mg by mouth 3 (three) times daily.   INSULIN LISPRO (HUMALOG) 100 UNIT/ML INJECTION    Inject 5 Units into the skin 4 (four) times daily -  before meals and at bedtime. For blood sugar over 150   INSULIN NPH-REGULAR (NOVOLIN 70/30) (70-30) 100 UNIT/ML  INJECTION    Inject 100 Units into the skin. Sliding scale   METFORMIN (GLUCOPHAGE) 500 MG TABLET    Take 500 mg by mouth 2 (two) times daily with a meal.   METOPROLOL (LOPRESSOR) 100 MG TABLET    Take 100 mg by mouth daily.   OXYBUTYNIN (DITROPAN-XL) 5 MG 24 HR TABLET       RAMIPRIL (ALTACE) 10 MG CAPSULE    TAKE ONE CAPSULE BY MOUTH TWICE DAILY   RANITIDINE (ZANTAC) 150 MG TABLET    Take 150 mg by mouth daily.   SENNA (SENOKOT) 8.6 MG TABLET    Take 1 tablet by mouth daily.   SITAGLIPTIN (JANUVIA) 100 MG TABLET    Take 100 mg by mouth daily.   TAMSULOSIN (FLOMAX) 0.4 MG CAPS CAPSULE       VITAMIN D, ERGOCALCIFEROL, (DRISDOL) 50000 UNITS CAPS    Take 50,000 Units by mouth 2 (two) times a week. Monday and Thursday  Modified  Medications   No medications on file  Discontinued Medications   No medications on file     Physical Exam: Physical Exam  Constitutional: He is well-developed, well-nourished, and in no distress.  HENT:  Head: Normocephalic and atraumatic.  Mouth/Throat: Oropharynx is clear and moist. No oropharyngeal exudate.  Eyes: Conjunctivae and EOM are normal. Pupils are equal, round, and reactive to light.  Neck: Normal range of motion. Neck supple. No thyromegaly present.  Cardiovascular: Normal rate, regular rhythm and normal heart sounds.   Pulmonary/Chest: Effort normal and breath sounds normal. No respiratory distress.  Abdominal: Soft. Bowel sounds are normal.  Musculoskeletal: He exhibits no edema and no tenderness.  Lymphadenopathy:    He has no cervical adenopathy.  Neurological: He is alert.  Skin: Skin is warm and dry. He is not diaphoretic.  Psychiatric: Affect normal.    Filed Vitals:   07/05/13 1731  BP: 158/68  Pulse: 84  Temp: 99 F (37.2 C)  Resp: 20      Labs reviewed: Basic Metabolic Panel:  Recent Labs  16/10/96 0403  NA 140  K 3.9  CL 103  CO2 25  GLUCOSE 198*  BUN 19  CREATININE 1.23  CALCIUM 9.4   Liver Function Tests: No results found for this basename: AST, ALT, ALKPHOS, BILITOT, PROT, ALBUMIN,  in the last 8760 hours No results found for this basename: LIPASE, AMYLASE,  in the last 8760 hours No results found for this basename: AMMONIA,  in the last 8760 hours CBC:  Recent Labs  07/03/13 0403  WBC 10.3  NEUTROABS 8.1*  HGB 13.0  HCT 36.8*  MCV 94.4  PLT 184   05/19/13 hgb A1c 6.2 Cholesterol total 128, LDL 70, hdl 39, trig 95  Assessment/Plan 1. CAD (coronary artery disease) -stable at this time, no complaints of chest pains  2. HTN (hypertension) -elevated today however it appears pts bp is hard to control base on multiple medication used -currently taking hydralazine TID, norvasc daily, lasix, lopressor, and ramipril    3. PAD (peripheral artery disease) -no complaints currently taking pletal  4. Stroke -conts on plavix  5. Hyperlipidemia -lipid profile done in nov; LDL at [facility]   6. Spastic hemiplegia affecting nondominant side -stable at this time  7. Type II or unspecified type diabetes mellitus with peripheral circulatory disorders, uncontrolled(250.72) -stable; conts on metformin, 70/30, humalog, and januvia -A1c in November is at goal at 6.2  8. UTI (urinary tract infection) -overall doing much better since he has been on  keflex  -conts antibiotic for 7 days of treatment

## 2013-07-06 ENCOUNTER — Telehealth (HOSPITAL_COMMUNITY): Payer: Self-pay | Admitting: Emergency Medicine

## 2013-07-06 NOTE — ED Notes (Signed)
Post ED Visit - Positive Culture Follow-up  Culture report reviewed by antimicrobial stewardship pharmacist: []  Marlou Sa, Pharm.D., BCPS []  Celedonio Miyamoto, Pharm.D., BCPS []  Georgina Pillion, [address].D., BCPS []  [place], [address].D., BCPS, AAHIVP []  Estella Husk, Pharm.D., BCPS, AAHIVP [x]  Harland German, Pharm.D., BCPS  Positive urine culture Treated with Keflex, organism sensitive to the same and no further patient follow-up is required at this time.  Marcelle Overlie, Jenel Lucks 07/06/2013, 11:44 AM

## 2013-08-02 ENCOUNTER — Encounter: Payer: Self-pay | Admitting: Nurse Practitioner

## 2013-08-02 ENCOUNTER — Non-Acute Institutional Stay (SKILLED_NURSING_FACILITY): Payer: Medicare Other | Admitting: Nurse Practitioner

## 2013-08-02 DIAGNOSIS — E1159 Type 2 diabetes mellitus with other circulatory complications: Secondary | ICD-10-CM

## 2013-08-02 DIAGNOSIS — I10 Essential (primary) hypertension: Secondary | ICD-10-CM

## 2013-08-02 DIAGNOSIS — R300 Dysuria: Secondary | ICD-10-CM

## 2013-08-02 DIAGNOSIS — I2510 Atherosclerotic heart disease of native coronary artery without angina pectoris: Secondary | ICD-10-CM

## 2013-08-02 DIAGNOSIS — G8110 Spastic hemiplegia affecting unspecified side: Secondary | ICD-10-CM

## 2013-08-02 DIAGNOSIS — K5900 Constipation, unspecified: Secondary | ICD-10-CM

## 2013-08-02 NOTE — Progress Notes (Signed)
Patient ID: Roger Berger, male   DOB: [birth year], 68 y.o.   MRN: [id number]    Nursing Home Location:  [facility]   Place of Service: SNF (31)  PCP: Laurena Slimmer, MD  No Known Allergies  Chief Complaint  Patient presents with  . 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  Respiratory: Negative for cough and shortness of breath.   Cardiovascular: Negative for chest pain, palpitations and leg swelling.  Gastrointestinal: Positive for constipation. Negative for heartburn, nausea, vomiting, abdominal pain and diarrhea.  Genitourinary: Positive for dysuria (buring at times; not always). Negative for urgency and frequency.  Musculoskeletal: Negative for myalgias.  Skin: Negative.   Neurological: Negative for dizziness, weakness and headaches.  Psychiatric/Behavioral: Negative for depression.     Past Medical History  Diagnosis Date  . Ataxia, late effect of cerebrovascular disease   . Aphasia, late effect of cerebrovascular disease   . Cerebral thrombosis   . Stroke   . Coronary artery disease   . Hypertension   . Diabetes mellitus    Past Surgical History  Procedure Laterality Date  . Coronary artery bypass graft     Social History:   reports that he has quit smoking. He has never used smokeless tobacco. He reports that he does not drink alcohol. His drug history is not on file.  Family History  Problem Relation Age of Onset  . Heart disease Father     Medications: Patient's Medications  New Prescriptions   No medications on file  Previous Medications   ACETAMINOPHEN (TYLENOL) 325 MG TABLET     Take 650 mg by mouth every 6 (six) hours as needed for mild pain, fever or headache.   AMLODIPINE (NORVASC) 10 MG TABLET    Take 10 mg by mouth daily.   ASPIRIN 81 MG CHEWABLE TABLET    Chew 81 mg by mouth daily.   ATORVASTATIN (LIPITOR) 10 MG TABLET    Take 10 mg by mouth daily.   CILOSTAZOL (PLETAL) 100 MG TABLET    Take 100 mg by mouth daily.    CLOPIDOGREL (PLAVIX) 75 MG TABLET    Take 1 tablet by mouth Daily.   DICLOFENAC SODIUM (VOLTAREN) 1 % GEL    Apply 2 g topically 4 (four) times daily.   FUROSEMIDE (LASIX) 40 MG TABLET    Take 40 mg by mouth daily.   HYDRALAZINE (APRESOLINE) 25 MG TABLET    Take 25 mg by mouth 3 (three) times daily.   INSULIN LISPRO (HUMALOG) 100 UNIT/ML INJECTION    Inject 5 Units into the skin 4 (four) times daily -  before meals and at bedtime. For blood sugar over 150   INSULIN NPH-REGULAR (NOVOLIN 70/30) (70-30) 100 UNIT/ML INJECTION    Inject 100 Units into the skin. Sliding scale   METFORMIN (GLUCOPHAGE) 500 MG TABLET    Take 500 mg by mouth 2 (two) times daily with a meal.   METOPROLOL (LOPRESSOR) 100 MG TABLET    Take 100 mg by mouth daily.   OXYBUTYNIN (DITROPAN-XL) 5 MG 24 HR TABLET       RAMIPRIL (ALTACE)  10 MG CAPSULE    TAKE ONE CAPSULE BY MOUTH TWICE DAILY   RANITIDINE (ZANTAC) 150 MG TABLET    Take 150 mg by mouth daily.   SENNA (SENOKOT) 8.6 MG TABLET    Take 1 tablet by mouth daily.   SITAGLIPTIN (JANUVIA) 100 MG TABLET    Take 100 mg by mouth daily.   TAMSULOSIN (FLOMAX) 0.4 MG CAPS CAPSULE       VITAMIN D, ERGOCALCIFEROL, (DRISDOL) 50000 UNITS CAPS    Take 50,000 Units by mouth 2 (two) times a week. Monday and Thursday  Modified Medications   No medications on file  Discontinued Medications   CEPHALEXIN (KEFLEX) 500 MG CAPSULE    Take 1 capsule (500 mg total) by mouth 3 (three) times daily.     Physical Exam: Physical Exam  Constitutional: He is well-developed, well-nourished, and in no distress.  HENT:  Head: Normocephalic and  atraumatic.  Mouth/Throat: Oropharynx is clear and moist. No oropharyngeal exudate.  Eyes: Conjunctivae and EOM are normal. Pupils are equal, round, and reactive to light.  Neck: Normal range of motion. Neck supple. No thyromegaly present.  Cardiovascular: Normal rate, regular rhythm and normal heart sounds.   Pulmonary/Chest: Effort normal and breath sounds normal. No respiratory distress.  Abdominal: Soft. Bowel sounds are normal.  Musculoskeletal: He exhibits no edema and no tenderness.  Lymphadenopathy:    He has no cervical adenopathy.  Neurological: He is alert.  Skin: Skin is warm and dry. He is not diaphoretic.  Psychiatric: Affect normal.    Filed Vitals:   08/02/13 1237  BP: 139/67  Pulse: 60  Temp: 97.1 F (36.2 C)  Resp: 18      Labs reviewed: Basic Metabolic Panel:  Recent Labs  08/65/7812/27/14 0403  NA 140  K 3.9  CL 103  CO2 25  GLUCOSE 198*  BUN 19  CREATININE 1.23  CALCIUM 9.4    Recent Labs  07/03/13 0403  WBC 10.3  NEUTROABS 8.1*  HGB 13.0  HCT 36.8*  MCV 94.4  PLT 184   05/19/13 hgb A1c 6.2 Cholesterol total 128, LDL 70, hdl 39, trig 95   Assessment/Plan 1. 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 Will monitor and make changes as necessary. -A1c at [facility] in nov; blood sugars reviewed  2. Spastic hemiplegia affecting nondominant side - conts to work with therapy; stable at this time  3. HTN (hypertension) -remains stable on current medications  4. CAD (coronary artery disease) -stable  5. Burning with urination -was treated for UTI last month -encouraged to increase hydration -will follow up UA C&S  6. Constipation -will add colace 100 mg BID

## 2013-09-06 ENCOUNTER — Non-Acute Institutional Stay (SKILLED_NURSING_FACILITY): Payer: Medicare Other | Admitting: Nurse Practitioner

## 2013-09-06 DIAGNOSIS — I2510 Atherosclerotic heart disease of native coronary artery without angina pectoris: Secondary | ICD-10-CM

## 2013-09-06 DIAGNOSIS — E1159 Type 2 diabetes mellitus with other circulatory complications: Secondary | ICD-10-CM

## 2013-09-06 DIAGNOSIS — G8110 Spastic hemiplegia affecting unspecified side: Secondary | ICD-10-CM

## 2013-09-06 DIAGNOSIS — I10 Essential (primary) hypertension: Secondary | ICD-10-CM

## 2013-09-06 DIAGNOSIS — E785 Hyperlipidemia, unspecified: Secondary | ICD-10-CM

## 2013-09-06 NOTE — Progress Notes (Signed)
Patient ID: Garey HamMitchell L Fruin, male   DOB: May 18, 1946, 68 y.o.   MRN: [id number]    Nursing [facility]   Place of Service: SNF (31)  PCP: Laurena SlimmerLARK,PRESTON S, MD  No Known Allergies  Chief Complaint  Patient presents with  . 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 Pt reports some nasal congestion and cough that has already mostly improved; on today's visit pt without any complaints and nursing without concerns. Review of Systems:  Review of Systems  Constitutional: Negative for fever, chills and malaise/fatigue.  Respiratory: Negative for cough and shortness of breath.   Cardiovascular: Negative for chest pain, palpitations and leg swelling.  Gastrointestinal: Negative for heartburn, nausea, vomiting, abdominal pain, diarrhea and constipation.  Genitourinary: Negative for dysuria, urgency and frequency.  Musculoskeletal: Negative for myalgias.  Skin: Negative.   Neurological: Negative for dizziness, weakness and headaches.  Psychiatric/Behavioral: Negative for depression. The patient is not nervous/anxious.      Past Medical History  Diagnosis Date  . Ataxia, late effect of cerebrovascular disease   . Aphasia, late effect of cerebrovascular disease   . Cerebral thrombosis   . Stroke   . Coronary artery disease   . Hypertension   . Diabetes mellitus    Past Surgical History  Procedure Laterality Date  . Coronary artery bypass graft     Social History:   reports that he has quit smoking. He has never used smokeless tobacco. He reports that he does not drink alcohol. His drug history is not on file.  Family History  Problem Relation Age of Onset  . Heart disease Father     Medications: Patient's  Medications  New Prescriptions   No medications on file  Previous Medications   ACETAMINOPHEN (TYLENOL) 325 MG TABLET    Take 650 mg by mouth every 6 (six) hours as needed for mild pain, fever or headache.   AMLODIPINE (NORVASC) 10 MG TABLET    Take 10 mg by mouth daily.   ASPIRIN 81 MG CHEWABLE TABLET    Chew 81 mg by mouth daily.   ATORVASTATIN (LIPITOR) 10 MG TABLET    Take 10 mg by mouth daily.   CILOSTAZOL (PLETAL) 100 MG TABLET    Take 100 mg by mouth daily.    CLOPIDOGREL (PLAVIX) 75 MG TABLET    Take 1 tablet by mouth Daily.   DICLOFENAC SODIUM (VOLTAREN) 1 % GEL    Apply 2 g topically 4 (four) times daily.   FUROSEMIDE (LASIX) 40 MG TABLET    Take 40 mg by mouth daily.   HYDRALAZINE (APRESOLINE) 25 MG TABLET    Take 25 mg by mouth 3 (three) times daily.   INSULIN LISPRO (HUMALOG) 100 UNIT/ML INJECTION    Inject 5 Units into the skin 4 (four) times daily -  before meals and at bedtime. For blood sugar over 150   INSULIN NPH-REGULAR (NOVOLIN 70/30) (70-30) 100 UNIT/ML INJECTION    Inject 100 Units into the skin. Sliding scale   METFORMIN (GLUCOPHAGE) 500 MG TABLET    Take 500 mg by mouth 2 (two) times daily with a meal.   METOPROLOL (LOPRESSOR) 100 MG TABLET    Take 100 mg by  mouth daily.   OXYBUTYNIN (DITROPAN-XL) 5 MG 24 HR TABLET       RAMIPRIL (ALTACE) 10 MG CAPSULE    TAKE ONE CAPSULE BY MOUTH TWICE DAILY   RANITIDINE (ZANTAC) 150 MG TABLET    Take 150 mg by mouth daily.   SENNA (SENOKOT) 8.6 MG TABLET    Take 1 tablet by mouth daily.   SITAGLIPTIN (JANUVIA) 100 MG TABLET    Take 100 mg by mouth daily.   TAMSULOSIN (FLOMAX) 0.4 MG CAPS CAPSULE       VITAMIN D, ERGOCALCIFEROL, (DRISDOL) 50000 UNITS CAPS    Take 50,000 Units by mouth 2 (two) times a week. Monday and Thursday  Modified Medications   No medications on file  Discontinued Medications   No medications on file     Physical Exam: Physical Exam  Constitutional: He is oriented to person, place, and time and  well-developed, well-nourished, and in no distress.  HENT:  Head: Normocephalic and atraumatic.  Mouth/Throat: Oropharynx is clear and moist. No oropharyngeal exudate.  Eyes: Conjunctivae and EOM are normal. Pupils are equal, round, and reactive to light.  Neck: Normal range of motion. Neck supple. No thyromegaly present.  Cardiovascular: Normal rate, regular rhythm and normal heart sounds.   Pulmonary/Chest: Effort normal and breath sounds normal. No respiratory distress.  Abdominal: Soft. Bowel sounds are normal.  Musculoskeletal: He exhibits no edema and no tenderness.  Left sided LE weakness   Lymphadenopathy:    He has no cervical adenopathy.  Neurological: He is alert and oriented to person, place, and time.  Skin: Skin is warm and dry. He is not diaphoretic.  Psychiatric: Affect normal.    Filed Vitals:   09/06/13 1142  BP: 136/74  Pulse: 64  Temp: 97.5 F (36.4 C)  Resp: 20      Labs reviewed: Basic Metabolic Panel:  Recent Labs  16/10/96 0403  NA 140  K 3.9  CL 103  CO2 25  GLUCOSE 198*  BUN 19  CREATININE 1.23  CALCIUM 9.4   CBC:  Recent Labs  07/03/13 0403  WBC 10.3  NEUTROABS 8.1*  HGB 13.0  HCT 36.8*  MCV 94.4  PLT 184   05/19/13  hgb A1c 6.2  Cholesterol total 128, LDL 70, hdl 39, trig 95   Assessment/Plan 1. HTN (hypertension) Patients hypertension is stable on norvasc, altace, lasix, lopressor, hydralazine ; continue current regimen. Will monitor and make changes as necessary.  2. CAD (coronary artery disease) No chest pains, conts on ASA, plavix, pleta   3. Type II or unspecified type diabetes mellitus with peripheral circulatory disorders, uncontrolled(250.72) -A1c was stable in November, pts blood sugars reviewed and within appropriate range for age and co- morbidies,  Will cont metformin, januvia, novolin and humalog  4. Spastic hemiplegia affecting nondominant side -stable; conts to work with therapy  5.  Hyperlipidemia -LDL at goal; conts on lipitor

## 2013-10-26 ENCOUNTER — Non-Acute Institutional Stay (SKILLED_NURSING_FACILITY): Payer: PRIVATE HEALTH INSURANCE | Admitting: Internal Medicine

## 2013-10-26 DIAGNOSIS — I69959 Hemiplegia and hemiparesis following unspecified cerebrovascular disease affecting unspecified side: Secondary | ICD-10-CM

## 2013-10-26 DIAGNOSIS — I2510 Atherosclerotic heart disease of native coronary artery without angina pectoris: Secondary | ICD-10-CM

## 2013-10-26 DIAGNOSIS — E1159 Type 2 diabetes mellitus with other circulatory complications: Secondary | ICD-10-CM

## 2013-10-28 NOTE — Progress Notes (Addendum)
Patient ID: Garey HamMitchell L Callins, male   DOB: 1945/10/30, 68 y.o.   MRN: [id number]                  PROGRESS NOTE  DATE:  10/26/2013    [facility]    LEVEL OF CARE:   SNF   Acute Visit   CHIEF COMPLAINT:  Review of admission and note to [facility], general medical issues.    HISTORY OF PRESENT ILLNESS:  This is a 68 year-old man who suffered a right pontine stroke in 2011.    [facility] and a major question was raised about the combination of Pletal, ASA, and Plavix.  At one point, he was on Coumadin although he had some form of bleeding.  As far as I can tell, he has been on this medical regimen for quite some time.    With regards to his atherosclerotic disease, this is really very extensive by review of his records.  First of all, he appears to have had a CABG in 2000.  He also has PAD with a left fem-pop which is occluded.  He may have a right fem-pop, as well.  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    His last echocardiogram was in 2011 at which time he had an EF of 50-55%, grade 2 diastolic dysfunction.    MRI of the brain, last checked in 2011, showed a 1 cm acute infarction in the left paramedian pons, bilateral thalamic deep white matter infarctions.    He was admitted to hospital in 2007 with a gastrointestinal hemorrhage with the site of bleeding not identified.  He was felt to have gastrointestinal blood loss related anemia.    With regards to his past medical history, other than the above, he is a diabetic with hypertension.    LABORATORY DATA:  Recent lab work from November 2014 showed:   Normal CBC.    Normal comprehensive metabolic panel.    His LDL was 70.    Hemoglobin A1c was 6.2.    REVIEW OF SYSTEMS:   CHEST/RESPIRATORY:  The patient is not complaining of exertional shortness of breath.   States he is able to get up and walk down the entire hall with his walker, although  most of the time he seems to spend time in a wheelchair.   CARDIAC:   No clear exertional chest pain.   MUSCULOSKELETAL:  Extremities:  It is really difficult to even get him to describe anything that sounds like claudication.    PHYSICAL EXAMINATION:   GENERAL APPEARANCE:  The patient looks very well.  He is  apparently quite independent.     CHEST/RESPIRATORY:  Clear air entry bilaterally.   CARDIOVASCULAR:  CARDIAC:   Heart sounds are normal.  There are no carotid bruits.   MUSCULOSKELETAL:  Extremities:  PAD, but no lesions.    ASSESSMENT/PLAN:  Coronary artery disease.  This appears to be stable.  Status post CABG.    Diabetic peripheral vascular disease.  Once again, he has an occluded left fem-pop.    History of cerebrovascular disease.  Status post pontine CVA and  apparently recurrent TIAs in the past.    Type 2 diabetes with circulatory complications.  His hemoglobin A1c is well controlled, as is his cholesterol level.    The combination of three antiplatelet medications makes little medical sense in terms of efficacy.  However, this is a very difficult case  and we have a patient who has widespread severe atherosclerotic disease in multiple organ systems.  Yet, he has remained almost ominously stable.  I am not of the mind to really change this at this point.     We will monitor his diabetes which seems to be under good control, as well as his lipids.    He does follow with Cardiology, although I do not see a recent appointment since last June 2014.

## 2013-11-23 ENCOUNTER — Non-Acute Institutional Stay (SKILLED_NURSING_FACILITY): Payer: PRIVATE HEALTH INSURANCE | Admitting: Internal Medicine

## 2013-11-23 DIAGNOSIS — I2510 Atherosclerotic heart disease of native coronary artery without angina pectoris: Secondary | ICD-10-CM

## 2013-11-23 DIAGNOSIS — E1159 Type 2 diabetes mellitus with other circulatory complications: Secondary | ICD-10-CM

## 2013-11-23 DIAGNOSIS — I739 Peripheral vascular disease, unspecified: Secondary | ICD-10-CM

## 2013-11-25 NOTE — Progress Notes (Addendum)
Patient ID: Roger Berger, male   DOB: [birth year], [age] y.o.   MRN: [id number]                  PROGRESS NOTE  DATE:  11/23/2013    [facility]    LEVEL OF CARE:   SNF   Acute Visit   CHIEF COMPLAINT:  [facility] visit for April, follow up cardiac status.    HISTORY OF PRESENT ILLNESS:  This is a patient who had coronary artery disease and a 5-vessel CABG in 2000.  He has extensive PAD and is status post bilateral femoral-popliteal bypass with occluded left femoral-popliteal graft.    He also has a history of a pontine stroke and two previous strokes.    As I reviewed last month, he is on a complicated antiplatelet group of drugs including Plavix, Pletal, and ASA 81 q.d.  However, given his extensive atherosclerotic history, I have left him on this.    He is on Lasix 40 mg daily.  The exact reason for this is not completely clear.    LABORATORY DATA:   Last lab work on 11/17/2013 showed an essentially normal comprehensive metabolic panel.  His BUN and creatinine were normal.  His potassium was 4.    REVIEW OF SYSTEMS:   CHEST/RESPIRATORY:  He is not complaining of shortness of breath.   CARDIAC:   No chest pain.   No orthopnea.  No palpitations.   GI:  No nausea or vomiting.    PHYSICAL EXAMINATION:   GENERAL APPEARANCE:  The patient remains well.   CHEST/RESPIRATORY:  Clear to auscultation.   CARDIOVASCULAR:  CARDIAC:   Heart sounds are normal.  There are no murmurs.   GASTROINTESTINAL:  LIVER/SPLEEN/KIDNEYS:  No liver, no spleen.  No tenderness.   CIRCULATION:  EDEMA/VARICOSITIES:  Extremities:  I do not see any major edema here, although he has had some edema in the past.    ASSESSMENT/PLAN:  Coronary artery disease.  Status post CABG in 2000.  This has remained remarkably stable.    Extensive PAD, coronary artery disease, and also a history of cerebrovascular disease.  I reviewed this last month.    Hypertension.  As far as I am aware, his hypertension  has remained stable.    Type 2 diabetes.  On Glucophage and Januvia as well as a sliding scale.  Last hemoglobin A1c was 7.7 and his Glucophage was increased to 750 b.i.d.  LDL cholesterol was 118.    I have been asked to respond to the issue of Pletal being contraindicated in heart failure.  I do not see that he actually has a diagnosis related to this, although he does have diastolic dysfunction based on an echocardiogram in 2011, I believe.

## 2013-12-02 LAB — CBC AND DIFFERENTIAL
HCT: 41 % (ref 41–53)
Hemoglobin: 13.2 g/dL — AB (ref 13.5–17.5)
PLATELETS: 228 10*3/uL (ref 150–399)
WBC: 6.5 10*3/mL

## 2013-12-02 LAB — BASIC METABOLIC PANEL
BUN: 18 mg/dL (ref 4–21)
CREATININE: 1.1 mg/dL (ref <=1.3)
Glucose: 147 mg/dL
Potassium: 4.1 mmol/L (ref 3.4–5.3)
Sodium: 145 mmol/L (ref 137–147)

## 2013-12-02 LAB — HEPATIC FUNCTION PANEL
ALK PHOS: 95 U/L (ref 25–125)
ALT: 9 U/L — AB (ref 10–40)
AST: 15 U/L (ref 14–40)
Bilirubin, Total: 0.2 mg/dL

## 2013-12-02 LAB — HEMOGLOBIN A1C: HEMOGLOBIN A1C: 7.7 % — AB (ref 4.0–6.0)

## 2013-12-07 ENCOUNTER — Encounter: Payer: Self-pay | Admitting: Internal Medicine

## 2013-12-07 ENCOUNTER — Non-Acute Institutional Stay (SKILLED_NURSING_FACILITY): Payer: PRIVATE HEALTH INSURANCE | Admitting: Internal Medicine

## 2013-12-07 DIAGNOSIS — Z951 Presence of aortocoronary bypass graft: Secondary | ICD-10-CM

## 2013-12-07 DIAGNOSIS — E785 Hyperlipidemia, unspecified: Secondary | ICD-10-CM

## 2013-12-07 DIAGNOSIS — I739 Peripheral vascular disease, unspecified: Secondary | ICD-10-CM

## 2013-12-07 DIAGNOSIS — G8110 Spastic hemiplegia affecting unspecified side: Secondary | ICD-10-CM

## 2013-12-07 DIAGNOSIS — I10 Essential (primary) hypertension: Secondary | ICD-10-CM

## 2013-12-07 DIAGNOSIS — I2510 Atherosclerotic heart disease of native coronary artery without angina pectoris: Secondary | ICD-10-CM

## 2013-12-07 NOTE — Assessment & Plan Note (Signed)
On statin.

## 2013-12-07 NOTE — Progress Notes (Signed)
MRN: [id number] Name: Roger Berger  Sex: male Age: 68 y.o. DOB: 1946/02/11  PSC #: Ronni Rumble  Facility/Room: 102a Level Of Care: SNF Provider: Margit Hanks Emergency Contacts: Extended Emergency Contact Information Primary Emergency Contact: Boniface,Annie Address: [address]          [place], [place] Home Phone: [phone] Mobile Phone: [phone] Relation: Spouse Secondary Emergency Contact: Hollenkamp,Antonio  [place] Home Phone: [phone] Relation: Son  Code Status:FULL   Allergies: Review of patient's allergies indicates no known allergies.  Chief Complaint  Patient presents with  . nursing home admission    HPI: Patient is 68 y.o. male who transferred to [facility] from another SNF.  Past Medical History  Diagnosis Date  . Ataxia, late effect of cerebrovascular disease   . Aphasia, late effect of cerebrovascular disease   . Cerebral thrombosis   . Stroke   . Coronary artery disease   . Hypertension   . Diabetes mellitus     Past Surgical History  Procedure Laterality Date  . Coronary artery bypass graft        Medication List       This list is accurate as of: 12/07/13 10:35 PM.  Always use your most recent med list.               acetaminophen 325 MG tablet  Commonly known as:  TYLENOL  Take 650 mg by mouth every 6 (six) hours as needed for mild pain, fever or headache.     amLODipine 10 MG tablet  Commonly known as:  NORVASC  Take 10 mg by mouth daily.     aspirin 81 MG chewable tablet  Chew 81 mg by mouth daily.     atorvastatin 10 MG tablet  Commonly known as:  LIPITOR  Take 10 mg by mouth daily.     cilostazol 100 MG tablet  Commonly known as:  PLETAL  Take 100 mg by mouth daily.     clopidogrel 75 MG tablet  Commonly known as:  PLAVIX  Take 1 tablet by mouth Daily.     furosemide 40 MG tablet  Commonly known as:  LASIX  Take 40 mg by mouth daily.     hydrALAZINE 25 MG  tablet  Commonly known as:  APRESOLINE  Take 25 mg by mouth 3 (three) times daily.     insulin lispro 100 UNIT/ML injection  Commonly known as:  HUMALOG  Inject 5 Units into the skin 4 (four) times daily -  before meals and at bedtime. For blood sugar over 150     metFORMIN 500 MG tablet  Commonly known as:  GLUCOPHAGE  Take 500 mg by mouth 2 (two) times daily with a meal.     metoprolol 100 MG tablet  Commonly known as:  LOPRESSOR  Take 100 mg by mouth daily.     oxybutynin 5 MG 24 hr tablet  Commonly known as:  DITROPAN-XL     pantoprazole 40 MG tablet  Commonly known as:  PROTONIX  Take 40 mg by mouth daily.     ramipril 10 MG capsule  Commonly known as:  ALTACE  TAKE ONE CAPSULE BY MOUTH TWICE DAILY     ranitidine 150 MG tablet  Commonly known as:  ZANTAC  Take 150 mg by mouth daily.     senna 8.6 MG tablet  Commonly known as:  SENOKOT  Take 1 tablet by mouth daily.     sitaGLIPtin 100  MG tablet  Commonly known as:  JANUVIA  Take 100 mg by mouth daily.     tamsulosin 0.4 MG Caps capsule  Commonly known as:  FLOMAX     Vitamin D (Ergocalciferol) 50000 UNITS Caps capsule  Commonly known as:  DRISDOL  Take 50,000 Units by mouth 2 (two) times a week. Monday and Thursday        Meds ordered this encounter  Medications  . pantoprazole (PROTONIX) 40 MG tablet    Sig: Take 40 mg by mouth daily.     There is no immunization history on file for this patient.  History  Substance Use Topics  . Smoking status: Former Games developermoker  . Smokeless tobacco: Never Used     Comment: quit about 30 years ago.  . Alcohol Use: No    Family history is noncontributory    Review of Systems  DATA OBTAINED: from patient GENERAL: Feels well no fevers, fatigue, appetite changes SKIN: No itching, rash or wounds EYES: No eye pain, redness, discharge EARS: No earache, tinnitus, change in hearing NOSE: No congestion, drainage or bleeding  MOUTH/THROAT: No mouth or tooth pain, No  sore throat RESPIRATORY: No cough, wheezing, SOB CARDIAC: No chest pain, palpitations, lower extremity edema  GI: No abdominal pain, No N/V/D or constipation, No heartburn or reflux  GU: No dysuria, frequency or urgency, or incontinence  MUSCULOSKELETAL: No unrelieved bone/joint pain NEUROLOGIC: No headache, dizziness or focal weakness PSYCHIATRIC: No overt anxiety or sadness. Sleeps well. No behavior issue.   Filed Vitals:   12/07/13 1605  BP: 126/66  Pulse: 59  Temp: 97.9 F (36.6 C)  Resp: 22    Physical Exam  GENERAL APPEARANCE: Alert, conversant. Appropriately groomed. No acute distress. Very pleasant BM SKIN: No diaphoresis rash HEAD: Normocephalic, atraumatic  EYES: Conjunctiva/lids clear. Pupils round, reactive. EOMs intact.  EARS: External exam WNL, canals clear. Hearing grossly normal.  NOSE: No deformity or discharge.  MOUTH/THROAT: Lips w/o lesions  RESPIRATORY: Breathing is even, unlabored. Lung sounds are clear   CARDIOVASCULAR: Heart RRR no murmurs, rubs or gallops. No peripheral edema.  GASTROINTESTINAL: Abdomen is soft, non-tender, not distended w/ normal bowel sounds. GENITOURINARY: Bladder non tender, not distended  MUSCULOSKELETAL:mild contractures L side NEUROLOGIC: Oriented X3. Cranial nerves 2-12 grossly intact; L side paresis PSYCHIATRIC: Mood and affect appropriate to situation, no behavioral issues  Patient Active Problem List   Diagnosis Date Noted  . Type II or unspecified type diabetes mellitus with peripheral circulatory disorders, uncontrolled(250.72) 07/05/2013  . CAD (coronary artery disease) 12/31/2012  . S/P CABG x 5 12/31/2012  . HTN (hypertension) 12/31/2012  . Hyperlipidemia 12/31/2012  . OA (osteoarthritis) of knee 12/31/2012  . Stroke 12/31/2012  . Foot drop 12/31/2012  . PAD (peripheral artery disease) 12/31/2012  . Carotid disease, bilateral 12/31/2012  . Ataxia, late effect of cerebrovascular disease 01/28/2012  . Spastic  hemiplegia affecting nondominant side 01/28/2012    CBC    Component Value Date/Time   WBC 10.3 07/03/2013 0403   RBC 3.90* 07/03/2013 0403   HGB 13.0 07/03/2013 0403   HCT 36.8* 07/03/2013 0403   PLT 184 07/03/2013 0403   MCV 94.4 07/03/2013 0403   LYMPHSABS 1.3 07/03/2013 0403   MONOABS 0.9 07/03/2013 0403   EOSABS 0.0 07/03/2013 0403   BASOSABS 0.0 07/03/2013 0403    CMP     Component Value Date/Time   NA 140 07/03/2013 0403   K 3.9 07/03/2013 0403   CL 103 07/03/2013 0403  CO2 25 07/03/2013 0403   GLUCOSE 198* 07/03/2013 0403   BUN 19 07/03/2013 0403   CREATININE 1.23 07/03/2013 0403   CALCIUM 9.4 07/03/2013 0403   PROT 7.1 05/09/2010 0650   ALBUMIN 3.6 05/09/2010 0650   AST 20 05/09/2010 0650   ALT 24 05/09/2010 0650   ALKPHOS 101 05/09/2010 0650   BILITOT 0.5 05/09/2010 0650   GFRNONAA 58* 07/03/2013 0403   GFRAA 67* 07/03/2013 0403    Assessment and Plan  Spastic hemiplegia affecting nondominant side L side weakness, ASA and statin, BP control  CAD (coronary artery disease) ACE, BBlocker, ASA, statin to continue  HTN (hypertension) Multiple meds =controlled  PAD (peripheral artery disease) ASA and statin  Hyperlipidemia On statin  S/P CABG x 5 See CAD    Margit Hanks, MD

## 2013-12-07 NOTE — Assessment & Plan Note (Signed)
See CAD 

## 2013-12-07 NOTE — Assessment & Plan Note (Signed)
ASA and statin 

## 2013-12-07 NOTE — Assessment & Plan Note (Addendum)
L side weakness, ASA and statin, BP control

## 2013-12-07 NOTE — Assessment & Plan Note (Signed)
Multiple meds =controlled

## 2013-12-07 NOTE — Assessment & Plan Note (Signed)
ACE, BBlocker, ASA, statin to continue

## 2014-01-18 ENCOUNTER — Non-Acute Institutional Stay (SKILLED_NURSING_FACILITY): Payer: Medicare Other | Admitting: Internal Medicine

## 2014-01-18 DIAGNOSIS — E1159 Type 2 diabetes mellitus with other circulatory complications: Secondary | ICD-10-CM

## 2014-01-18 DIAGNOSIS — I209 Angina pectoris, unspecified: Secondary | ICD-10-CM

## 2014-01-18 DIAGNOSIS — I25119 Atherosclerotic heart disease of native coronary artery with unspecified angina pectoris: Secondary | ICD-10-CM

## 2014-01-18 DIAGNOSIS — I10 Essential (primary) hypertension: Secondary | ICD-10-CM

## 2014-01-18 DIAGNOSIS — E785 Hyperlipidemia, unspecified: Secondary | ICD-10-CM

## 2014-01-18 DIAGNOSIS — I2510 Atherosclerotic heart disease of native coronary artery without angina pectoris: Secondary | ICD-10-CM

## 2014-01-18 DIAGNOSIS — G8110 Spastic hemiplegia affecting unspecified side: Secondary | ICD-10-CM

## 2014-01-18 NOTE — Progress Notes (Signed)
MRN: [id number] Name: Roger Berger  Sex: male Age: 68 y.o. DOB: 1946/02/26  PSC #: Ronni Rumble Facility/Room:102a Level Of Care: SNF Provider: Merrilee Seashore D Emergency Contacts: Extended Emergency Contact Information Primary Emergency Contact: Matton,Annie Address: [address]          [place], [place] Home Phone: [phone] Mobile Phone: [phone] Relation: Spouse Secondary Emergency Contact: Tyrell [place] Home Phone: [phone] Relation: Son  Code Status:   Allergies: Review of patient's allergies indicates no known allergies.  Chief Complaint  Patient presents with  . Medical Management of Chronic Issues    HPI: Patient is 68 y.o. male who is being seen for routine issues. Pt has been stable.  Past Medical History  Diagnosis Date  . Ataxia, late effect of cerebrovascular disease   . Aphasia, late effect of cerebrovascular disease   . Cerebral thrombosis   . Stroke   . Coronary artery disease   . Hypertension   . Diabetes mellitus     Past Surgical History  Procedure Laterality Date  . Coronary artery bypass graft        Medication List       This list is accurate as of: 01/18/14 11:59 PM.  Always use your most recent med list.               acetaminophen 325 MG tablet  Commonly known as:  TYLENOL  Take 650 mg by mouth every 6 (six) hours as needed for mild pain, fever or headache.     amLODipine 10 MG tablet  Commonly known as:  NORVASC  Take 10 mg by mouth daily.     aspirin 81 MG chewable tablet  Chew 81 mg by mouth daily.     atorvastatin 10 MG tablet  Commonly known as:  LIPITOR  Take 10 mg by mouth daily.     cilostazol 100 MG tablet  Commonly known as:  PLETAL  Take 100 mg by mouth daily.     clopidogrel 75 MG tablet  Commonly known as:  PLAVIX  Take 1 tablet by mouth Daily.     furosemide 40 MG tablet  Commonly known as:  LASIX  Take 40 mg by mouth daily.      hydrALAZINE 25 MG tablet  Commonly known as:  APRESOLINE  Take 25 mg by mouth 3 (three) times daily.     insulin lispro 100 UNIT/ML injection  Commonly known as:  HUMALOG  Inject 5 Units into the skin 4 (four) times daily -  before meals and at bedtime. For blood sugar over 150     metFORMIN 500 MG tablet  Commonly known as:  GLUCOPHAGE  Take 500 mg by mouth 2 (two) times daily with a meal.     metoprolol 100 MG tablet  Commonly known as:  LOPRESSOR  Take 100 mg by mouth daily.     oxybutynin 5 MG 24 hr tablet  Commonly known as:  DITROPAN-XL     pantoprazole 40 MG tablet  Commonly known as:  PROTONIX  Take 40 mg by mouth daily.     ramipril 10 MG capsule  Commonly known as:  ALTACE  TAKE ONE CAPSULE BY MOUTH TWICE DAILY     ranitidine 150 MG tablet  Commonly known as:  ZANTAC  Take 150 mg by mouth daily.     senna 8.6 MG tablet  Commonly known as:  SENOKOT  Take 1 tablet by mouth daily.  sitaGLIPtin 100 MG tablet  Commonly known as:  JANUVIA  Take 100 mg by mouth daily.     tamsulosin 0.4 MG Caps capsule  Commonly known as:  FLOMAX     Vitamin D (Ergocalciferol) 50000 UNITS Caps capsule  Commonly known as:  DRISDOL  Take 50,000 Units by mouth 2 (two) times a week. Monday and Thursday        No orders of the defined types were placed in this encounter.     There is no immunization history on file for this patient.  History  Substance Use Topics  . Smoking status: Former Games developer  . Smokeless tobacco: Never Used     Comment: quit about 30 years ago.  . Alcohol Use: No    Review of Systems  DATA OBTAINED: from patient; no c/o GENERAL: Feels well no fevers, fatigue, appetite changes SKIN: No itching, rash HEENT: No complaint RESPIRATORY: No cough, wheezing, SOB CARDIAC: No chest pain, palpitations, lower extremity edema  GI: No abdominal pain, No N/V/D or constipation, No heartburn or reflux  GU: No dysuria, frequency or urgency, or  incontinence  MUSCULOSKELETAL: No unrelieved bone/joint pain NEUROLOGIC: No headache, dizziness or focal weakness PSYCHIATRIC: No overt anxiety or sadness. Sleeps well.   Filed Vitals:   01/18/14 1739  BP: 135/63  Pulse: 65  Temp: 98 F (36.7 C)  Resp: 19    Physical Exam  GENERAL APPEARANCE: Alert, conversant. Appropriately groomed. No acute distress  SKIN: No diaphoresis rash HEENT: Unremarkable RESPIRATORY: Breathing is even, unlabored. Lung sounds are clear   CARDIOVASCULAR: Heart RRR no murmurs, rubs or gallops. No peripheral edema  GASTROINTESTINAL: Abdomen is soft, non-tender, not distended w/ normal bowel sounds.  GENITOURINARY: Bladder non tender, not distended  MUSCULOSKELETAL: No abnormal joints or musculature NEUROLOGIC: Cranial nerves 2-12 grossly intact. Movement of LUE improved PSYCHIATRIC: Mood and affect appropriate to situation, no behavioral issues  Patient Active Problem List   Diagnosis Date Noted  . Type II or unspecified type diabetes mellitus with peripheral circulatory disorders, uncontrolled(250.72) 07/05/2013  . CAD (coronary artery disease) 12/31/2012  . S/P CABG x 5 12/31/2012  . HTN (hypertension) 12/31/2012  . Hyperlipidemia 12/31/2012  . OA (osteoarthritis) of knee 12/31/2012  . Stroke 12/31/2012  . Foot drop 12/31/2012  . PAD (peripheral artery disease) 12/31/2012  . Carotid disease, bilateral 12/31/2012  . Ataxia, late effect of cerebrovascular disease 01/28/2012  . Spastic hemiplegia affecting nondominant side 01/28/2012    CBC    Component Value Date/Time   WBC 10.3 07/03/2013 0403   RBC 3.90* 07/03/2013 0403   HGB 13.0 07/03/2013 0403   HCT 36.8* 07/03/2013 0403   PLT 184 07/03/2013 0403   MCV 94.4 07/03/2013 0403   LYMPHSABS 1.3 07/03/2013 0403   MONOABS 0.9 07/03/2013 0403   EOSABS 0.0 07/03/2013 0403   BASOSABS 0.0 07/03/2013 0403    CMP     Component Value Date/Time   NA 140 07/03/2013 0403   K 3.9 07/03/2013 0403    CL 103 07/03/2013 0403   CO2 25 07/03/2013 0403   GLUCOSE 198* 07/03/2013 0403   BUN 19 07/03/2013 0403   CREATININE 1.23 07/03/2013 0403   CALCIUM 9.4 07/03/2013 0403   PROT 7.1 05/09/2010 0650   ALBUMIN 3.6 05/09/2010 0650   AST 20 05/09/2010 0650   ALT 24 05/09/2010 0650   ALKPHOS 101 05/09/2010 0650   BILITOT 0.5 05/09/2010 0650   GFRNONAA 58* 07/03/2013 0403   GFRAA 67* 07/03/2013 0403  Assessment and Plan  CAD (coronary artery disease) Continue present meds;no CP or equivalents  HTN (hypertension) Continues controlled even though on multiple meds  Type II or unspecified type diabetes mellitus with peripheral circulatory disorders, uncontrolled(250.72) Pt on metformin , januvia and ACE and statin  Spastic hemiplegia affecting nondominant side Pt appears to have some inc in movement L side  Hyperlipidemia Continue Lipitor 10 mg    Margit HanksALEXANDER, Cohen Doleman D, MD

## 2014-01-23 ENCOUNTER — Encounter: Payer: Self-pay | Admitting: Internal Medicine

## 2014-01-23 NOTE — Assessment & Plan Note (Signed)
Pt on metformin , januvia and ACE and statin

## 2014-01-23 NOTE — Assessment & Plan Note (Signed)
Continue present meds;no CP or equivalents

## 2014-01-23 NOTE — Assessment & Plan Note (Signed)
Continues controlled even though on multiple meds

## 2014-01-23 NOTE — Assessment & Plan Note (Signed)
Pt appears to have some inc in movement L side

## 2014-01-23 NOTE — Assessment & Plan Note (Signed)
Continue Lipitor 10 mg.

## 2014-02-16 ENCOUNTER — Encounter: Payer: Self-pay | Admitting: Internal Medicine

## 2014-02-16 ENCOUNTER — Non-Acute Institutional Stay (SKILLED_NURSING_FACILITY): Payer: Medicare Other | Admitting: Internal Medicine

## 2014-02-16 DIAGNOSIS — I739 Peripheral vascular disease, unspecified: Secondary | ICD-10-CM

## 2014-02-16 DIAGNOSIS — I2583 Coronary atherosclerosis due to lipid rich plaque: Secondary | ICD-10-CM

## 2014-02-16 DIAGNOSIS — E785 Hyperlipidemia, unspecified: Secondary | ICD-10-CM

## 2014-02-16 DIAGNOSIS — I10 Essential (primary) hypertension: Secondary | ICD-10-CM

## 2014-02-16 DIAGNOSIS — G8110 Spastic hemiplegia affecting unspecified side: Secondary | ICD-10-CM

## 2014-02-16 DIAGNOSIS — E1149 Type 2 diabetes mellitus with other diabetic neurological complication: Secondary | ICD-10-CM

## 2014-02-16 DIAGNOSIS — I2510 Atherosclerotic heart disease of native coronary artery without angina pectoris: Secondary | ICD-10-CM

## 2014-02-16 NOTE — Progress Notes (Signed)
Patient ID: Roger Berger, male   DOB: September 29, 1945, 68 y.o.   MRN: [id number]  Location:  Location:  [facility] Provider:  Gwenith Spitz. Renato Gails, D.O., C.M.D.  Code Status:  Full  Chief Complaint  Patient presents with  . Medical Management of Chronic Issues    HPI:  68 yo black male long term care resident seen for med mgt of chronic diseases.  He was transferred from another SNF in early June [facility]).  He is s/p CVA with ataxia, aphasia, cerebral thrombosis, CAD, htn, DM.  His last hba1c was 7.7 on 5/28.  He c/o pain in his left shoulder.  He is receiving therapy.  He needs podiatry to follow him here.  Review of Systems:  Review of Systems  Constitutional: Negative for fever and chills.  HENT: Negative for congestion.   Eyes: Negative for blurred vision.  Respiratory: Negative for shortness of breath.   Cardiovascular: Negative for chest pain.  Gastrointestinal: Negative for abdominal pain, constipation, blood in stool and melena.  Genitourinary: Negative for dysuria.  Musculoskeletal: Negative for falls.  Skin: Negative for rash.       Long thick toenails need trimming  Neurological: Positive for sensory change and focal weakness.       Ataxia, aphasia  Endo/Heme/Allergies:       Diabetes  Psychiatric/Behavioral: Positive for memory loss.    Medications: Patient's Medications  New Prescriptions   No medications on file  Previous Medications   ACETAMINOPHEN (TYLENOL) 325 MG TABLET    Take 650 mg by mouth every 6 (six) hours as needed for mild pain, fever or headache.   AMLODIPINE (NORVASC) 10 MG TABLET    Take 10 mg by mouth daily.   ASPIRIN 81 MG CHEWABLE TABLET    Chew 81 mg by mouth daily.   ATORVASTATIN (LIPITOR) 10 MG TABLET    Take 10 mg by mouth daily.   CILOSTAZOL (PLETAL) 100 MG TABLET    Take 100 mg by mouth daily.    CLOPIDOGREL (PLAVIX) 75 MG TABLET    Take 1 tablet by mouth Daily.   DICLOFENAC SODIUM (VOLTAREN) 1 % GEL    Apply 2 g  topically every 6 (six) hours as needed. For pain to shoulder, use 4 g for pain to knees every 6 hours prn.   FUROSEMIDE (LASIX) 40 MG TABLET    Take 40 mg by mouth daily.   HYDRALAZINE (APRESOLINE) 25 MG TABLET    Take 25 mg by mouth 3 (three) times daily.   INSULIN DETEMIR (LEVEMIR FLEXPEN) 100 UNIT/ML PEN    Inject 10 Units into the skin daily at 6 PM. For diabetes   INSULIN LISPRO (HUMALOG) 100 UNIT/ML INJECTION    Inject 5 Units into the skin 4 (four) times daily -  before meals and at bedtime. For blood sugar over 150   METFORMIN (GLUCOPHAGE) 500 MG TABLET    Take 1,250 mg by mouth 2 (two) times daily with a meal.    METOPROLOL (LOPRESSOR) 100 MG TABLET    Take 100 mg by mouth daily.   OXYBUTYNIN (DITROPAN-XL) 5 MG 24 HR TABLET    Take 5 mg by mouth daily.    PANTOPRAZOLE (PROTONIX) 40 MG TABLET    Take 40 mg by mouth daily.   PROMETHAZINE (PHENERGAN) 25 MG/ML INJECTION    Inject 25 mg into the vein once.   RAMIPRIL (ALTACE) 10 MG CAPSULE    TAKE ONE CAPSULE BY MOUTH TWICE DAILY  RANITIDINE (ZANTAC) 150 MG TABLET    Take 150 mg by mouth daily.   SENNA (SENOKOT) 8.6 MG TABLET    Take 1 tablet by mouth daily.   SITAGLIPTIN (JANUVIA) 100 MG TABLET    Take 100 mg by mouth daily.   TAMSULOSIN (FLOMAX) 0.4 MG CAPS CAPSULE    Take 0.4 mg by mouth daily.    VITAMIN D, ERGOCALCIFEROL, (DRISDOL) 50000 UNITS CAPS    Take 50,000 Units by mouth 2 (two) times a week. Monday and Thursday  Modified Medications   No medications on file  Discontinued Medications   No medications on file    Physical Exam: Filed Vitals:   02/16/14 1051  BP: 134/61  Pulse: 69  Temp: 97.4 F (36.3 C)  Resp: 16  Height: 5\' 9"  (1.753 m)  Weight: 167 lb (75.751 kg)  SpO2: 97%  Physical Exam  Constitutional: He appears well-developed and well-nourished. No distress.  Cardiovascular: Normal rate, regular rhythm, normal heart sounds and intact distal pulses.   Pulmonary/Chest: Effort normal and breath sounds normal.  No respiratory distress.  Abdominal: Soft. Bowel sounds are normal. He exhibits no distension and no mass. There is no tenderness.  Musculoskeletal: He exhibits tenderness.  Neurological: He is alert.  Aphasic, right sided weakness, tenderness in shoulder  Skin: Skin is warm and dry.  Psychiatric: He has a normal mood and affect.    Labs reviewed: Basic Metabolic Panel:  Recent Labs  16/04/9611/27/14 0403 12/02/13  NA 140 145  K 3.9 4.1  CL 103  --   CO2 25  --   GLUCOSE 198*  --   BUN 19 18  CREATININE 1.23 1.1  CALCIUM 9.4  --     Liver Function Tests:  Recent Labs  12/02/13  AST 15  ALT 9*  ALKPHOS 95    CBC:  Recent Labs  07/03/13 0403 12/02/13  WBC 10.3 6.5  NEUTROABS 8.1*  --   HGB 13.0 13.2*  HCT 36.8* 41  MCV 94.4  --   PLT 184 228   Assessment/Plan 1. Spastic hemiplegia affecting nondominant side -receiving PT OT -has pain in shoulder that seems due to his spasticity  2. Hyperlipidemia -cont lipitor -f/u labs as below  3. Coronary artery disease due to lipid rich plaque -cont secondary MI prevention with bp, lipid and glucose control, cont plavix and pletal also  4. Essential hypertension -bp at goal with altace, lopressor, hydralazine, lasix, norvasc  5. PAD (peripheral artery disease) -cont asa, plavix and pletal, monitor for bleeding--? Needs all three of these--consider stopping asa and definitely would if he has any bleeding complications  6. Diabetes mellitus type 2 with neurological manifestations -at goal for his age, but could be a bit better with his risk factors -would shoot for hba1c of 7 or less with his recent stroke -f/u hba1c in 1 month and consider changing to just insulin   Family/ staff Communication: discussed with his nurse  Goals of care: long term care, is full code  Labs/tests ordered:  Hba1c, flp, bmp in 1 month; consider discontinuing po diabetic meds and titrating insulin

## 2014-05-11 ENCOUNTER — Non-Acute Institutional Stay (SKILLED_NURSING_FACILITY): Payer: Medicare Other | Admitting: Internal Medicine

## 2014-05-11 ENCOUNTER — Encounter: Payer: Self-pay | Admitting: Internal Medicine

## 2014-05-11 DIAGNOSIS — IMO0002 Reserved for concepts with insufficient information to code with codable children: Secondary | ICD-10-CM

## 2014-05-11 DIAGNOSIS — E1165 Type 2 diabetes mellitus with hyperglycemia: Secondary | ICD-10-CM

## 2014-05-11 DIAGNOSIS — K5901 Slow transit constipation: Secondary | ICD-10-CM

## 2014-05-11 DIAGNOSIS — I10 Essential (primary) hypertension: Secondary | ICD-10-CM

## 2014-05-11 DIAGNOSIS — N400 Enlarged prostate without lower urinary tract symptoms: Secondary | ICD-10-CM

## 2014-05-11 NOTE — Progress Notes (Signed)
Patient ID: Roger Berger, male   DOB: 21-Mar-1946, 68 y.o.   MRN: [id number]   Place of Service: [facility]  No Known Allergies  Code Status: Full Code  Goals of Care: Longevity/Long term care   Chief Complaint  Patient presents with  . Medical Management of Chronic Issues    DM2, HTN, constipation, BPH    HPI 68 y.o. male with PMH of old CVA, DM2, HTN, BPH among others is being seen for a routine visit for management of his chronic issues. Weight stable. No recent falls or skin concerns reported. No change in behaviors or functional status reported. No concerns from nursing staff. No complaints verbalized from resident.   Review of Systems Constitutional: Negative for fever, chills, and fatigue. HENT: Negative for facial swelling, ear pain, congestion, and sore throat Eyes: Negative for eye pain, eye discharge, and visual disturbance  Cardiovascular: Negative for chest pain, palpitations, and leg swelling Respiratory: Negative cough, shortness of breath, and wheezing.  Gastrointestinal: Negative for nausea and vomiting. Negative for abdominal pain, diarrhea and constipation.  Musculoskeletal: Negative for back pain, joint pain, and joint swelling. Positive for L sided weakness  Neurological: Negative for dizziness, headache, weakness, and tremors.  Skin: Negative for rash and wound.   Psychiatric: Negative for nervous/anxious, agitation, and depression.   Past Medical History  Diagnosis Date  . Ataxia, late effect of cerebrovascular disease   . Aphasia, late effect of cerebrovascular disease   . Cerebral thrombosis   . Stroke   . Coronary artery disease   . Hypertension   . Diabetes mellitus     Past Surgical History  Procedure Laterality Date  . Coronary artery bypass graft      History   Social History  . Marital Status: Married    Spouse Name: N/A    Number of Children: N/A  . Years of Education: N/A   Occupational History  . Not on  file.   Social History Main Topics  . Smoking status: Former Games developer  . Smokeless tobacco: Never Used     Comment: quit about 30 years ago.  . Alcohol Use: No  . Drug Use: Not on file  . Sexual Activity: Not on file   Other Topics Concern  . Not on file   Social History Narrative      Medication List       This list is accurate as of: 05/11/14  2:11 PM.  Always use your most recent med list.               acetaminophen 325 MG tablet  Commonly known as:  TYLENOL  Take 650 mg by mouth every 6 (six) hours as needed for mild pain, fever or headache.     amLODipine 10 MG tablet  Commonly known as:  NORVASC  Take 10 mg by mouth daily.     aspirin 81 MG chewable tablet  Chew 81 mg by mouth daily.     atorvastatin 10 MG tablet  Commonly known as:  LIPITOR  Take 10 mg by mouth daily.     cilostazol 100 MG tablet  Commonly known as:  PLETAL  Take 100 mg by mouth daily.     clopidogrel 75 MG tablet  Commonly known as:  PLAVIX  Take 1 tablet by mouth Daily.     diclofenac sodium 1 % Gel  Commonly known as:  VOLTAREN  Apply 2 g topically every 6 (six) hours as needed. For pain to shoulder,  use 4 g for pain to knees every 6 hours prn.     docusate sodium 100 MG capsule  Commonly known as:  COLACE  Take 100 mg by mouth 2 (two) times daily.     furosemide 40 MG tablet  Commonly known as:  LASIX  Take 40 mg by mouth daily.     hydrALAZINE 25 MG tablet  Commonly known as:  APRESOLINE  Take 25 mg by mouth daily.     insulin lispro 100 UNIT/ML injection  Commonly known as:  HUMALOG  Inject 5 Units into the skin 4 (four) times daily -  before meals and at bedtime. For blood sugar over 150     LEVEMIR FLEXPEN 100 UNIT/ML Pen  Generic drug:  Insulin Detemir  Inject 10 Units into the skin daily at 6 PM. For diabetes     metFORMIN 500 MG tablet  Commonly known as:  GLUCOPHAGE  Take 1,250 mg by mouth 2 (two) times daily with a meal.     metoprolol 100 MG tablet    Commonly known as:  LOPRESSOR  Take 100 mg by mouth daily.     oxybutynin 5 MG 24 hr tablet  Commonly known as:  DITROPAN-XL  Take 5 mg by mouth daily.     pantoprazole 40 MG tablet  Commonly known as:  PROTONIX  Take 40 mg by mouth daily.     promethazine 25 MG/ML injection  Commonly known as:  PHENERGAN  Inject 25 mg into the vein once.     ramipril 10 MG capsule  Commonly known as:  ALTACE  TAKE ONE CAPSULE BY MOUTH TWICE DAILY     ranitidine 150 MG tablet  Commonly known as:  ZANTAC  Take 150 mg by mouth daily.     senna 8.6 MG tablet  Commonly known as:  SENOKOT  Take 1 tablet by mouth daily.     sitaGLIPtin 100 MG tablet  Commonly known as:  JANUVIA  Take 100 mg by mouth daily.     tamsulosin 0.4 MG Caps capsule  Commonly known as:  FLOMAX  Take 0.4 mg by mouth daily.     Vitamin D (Ergocalciferol) 50000 UNITS Caps capsule  Commonly known as:  DRISDOL  Take 50,000 Units by mouth 2 (two) times a week. Monday and Thursday        Physical Exam Filed Vitals:   05/11/14 1356  BP: 122/54  Pulse: 64  Temp: 98.1 F (36.7 C)  Resp: 18   Constitutional: WDWN elderly male in no acute distress. Very pleasant and conversant.  HEENT: Normocephalic and atraumatic. PERRL. EOM intact. No icterus. No nasal discharge or sinus tenderness. Oral mucosa moist. Posterior pharynx clear of any exudate or lesions. Poor dentition, missing teeth.  Neck: Supple and nontender. No lymphadenopathy, masses, or thyromegaly. No JVD or carotid bruits. Cardiac: Normal S1, S2. RRR without appreciable murmurs, rubs, or gallops. Distal pulses intact. No dependent edema.  Lungs: No respiratory distress. Breath sounds clear bilaterally without rales, rhonchi, or wheezes. Abdomen: Audible bowel sounds in all quadrants. Soft, nontender, nondistended. No palpable mass.  Musculoskeletal: able to move all extremities. L sided hemiplegia. LLE brace in place. No joint erythema or tenderness. Skin:  Warm and dry. No rash noted. No erythema.  Neurological: Alert and oriented to person, place, and time.  Psychiatric: Judgment and insight adequate. Appropriate mood and affect.   Labs Reviewed CBC Latest Ref Rng 12/02/2013 07/03/2013 05/18/2010  WBC - 6.5 10.3 5.7  Hemoglobin  13.5 - 17.5 g/dL 13.2(A) 13.0 12.0(L)  Hematocrit 41 - 53 % 41 36.8(L) 36.9(L)  Platelets 150 - 399 K/L 228 184 228    CMP     Component Value Date/Time   NA 145 12/02/2013   NA 140 07/03/2013 0403   K 4.1 12/02/2013   CL 103 07/03/2013 0403   CO2 25 07/03/2013 0403   GLUCOSE 198* 07/03/2013 0403   BUN 18 12/02/2013   BUN 19 07/03/2013 0403   CREATININE 1.1 12/02/2013   CREATININE 1.23 07/03/2013 0403   CALCIUM 9.4 07/03/2013 0403   PROT 7.1 05/09/2010 0650   ALBUMIN 3.6 05/09/2010 0650   AST 15 12/02/2013   ALT 9* 12/02/2013   ALKPHOS 95 12/02/2013   BILITOT 0.5 05/09/2010 0650   GFRNONAA 58* 07/03/2013 0403   GFRAA 67* 07/03/2013 0403   Lab Results  Component Value Date   HGBA1C 7.7* 12/02/2013   Assessment & Plan 1. Essential hypertension Stable. Continue ramipril 100mg  twice daily, hydralazine 25mg  daily, lasix 40mg  daily, metoprolol 100mg  daily, and norvasc 20mg  daily. Continue to monitor.  2. Type 2 diabetes mellitus, uncontrolled Last a1c 7.7 on 12/02/13. CBGs for past few days range between 96-150. Continue januvia 100mg  daily, metformin 1250mg  twice daily, levemir 10units daily, humalog 5 units before meals for CBG's > 150. Continue asa 81mg  daily, lipid treatment with statin, and ACEI for renal protection. Will check A1C. Lipids, and urine microalbumin/creatinine ratio. Continue to monitor.   3. Slow transit constipation Stable. Continue colace 100mg  twice daily and monitor.   4. BPH (benign prostatic hyperplasia) Stable. Continue flomax 0.4mg  daily and monitor.    Labs Ordered: A1C, lipid panel, CMP, urine microalbumin/creatitine ratio  Family/Staff Communication Plan of care  discuss with resident and professional staff members. Resident and professional staff members verbalize understanding and agree with plan of care. No additional questions or concerns reported.    Loura BackKim Larayne Baxley, MSN, AGNP-C [facility] [address] [place], [place] [address] [phone] [8am-5pm] After hours: [phone]

## 2014-05-12 LAB — HEMOGLOBIN A1C: HEMOGLOBIN A1C: 6 % (ref 4.0–6.0)

## 2014-05-12 LAB — BASIC METABOLIC PANEL
BUN: 23 mg/dL — AB (ref 4–21)
CREATININE: 1.5 mg/dL — AB (ref 0.6–1.3)
GLUCOSE: 96 mg/dL
POTASSIUM: 4.5 mmol/L (ref 3.4–5.3)
SODIUM: 141 mmol/L (ref 137–147)

## 2014-05-12 LAB — HEPATIC FUNCTION PANEL
ALT: 13 U/L (ref 10–40)
AST: 15 U/L (ref 14–40)
Alkaline Phosphatase: 64 U/L (ref 25–125)
BILIRUBIN, TOTAL: 0.3 mg/dL

## 2014-05-12 LAB — LIPID PANEL
Cholesterol: 150 mg/dL (ref 0–200)
HDL: 51 mg/dL (ref 35–70)
LDL CALC: 86 mg/dL
Triglycerides: 61 mg/dL (ref 40–160)

## 2014-05-12 LAB — ALBUMIN, URINE, RANDOM: Microalb, Ur: 1.2

## 2014-06-13 ENCOUNTER — Non-Acute Institutional Stay (SKILLED_NURSING_FACILITY): Payer: Medicare Other | Admitting: Adult Health

## 2014-06-13 DIAGNOSIS — K219 Gastro-esophageal reflux disease without esophagitis: Secondary | ICD-10-CM

## 2014-06-13 DIAGNOSIS — E559 Vitamin D deficiency, unspecified: Secondary | ICD-10-CM

## 2014-06-13 DIAGNOSIS — N400 Enlarged prostate without lower urinary tract symptoms: Secondary | ICD-10-CM

## 2014-06-13 DIAGNOSIS — E785 Hyperlipidemia, unspecified: Secondary | ICD-10-CM

## 2014-06-13 DIAGNOSIS — I639 Cerebral infarction, unspecified: Secondary | ICD-10-CM

## 2014-06-13 DIAGNOSIS — I739 Peripheral vascular disease, unspecified: Secondary | ICD-10-CM

## 2014-06-13 DIAGNOSIS — E1140 Type 2 diabetes mellitus with diabetic neuropathy, unspecified: Secondary | ICD-10-CM

## 2014-06-13 DIAGNOSIS — I10 Essential (primary) hypertension: Secondary | ICD-10-CM

## 2014-06-13 DIAGNOSIS — E1149 Type 2 diabetes mellitus with other diabetic neurological complication: Secondary | ICD-10-CM

## 2014-06-19 ENCOUNTER — Encounter: Payer: Self-pay | Admitting: Adult Health

## 2014-06-19 DIAGNOSIS — N400 Enlarged prostate without lower urinary tract symptoms: Secondary | ICD-10-CM

## 2014-06-19 DIAGNOSIS — E559 Vitamin D deficiency, unspecified: Secondary | ICD-10-CM

## 2014-06-19 DIAGNOSIS — E1149 Type 2 diabetes mellitus with other diabetic neurological complication: Secondary | ICD-10-CM | POA: Insufficient documentation

## 2014-06-19 DIAGNOSIS — K219 Gastro-esophageal reflux disease without esophagitis: Secondary | ICD-10-CM | POA: Insufficient documentation

## 2014-06-19 HISTORY — DX: Vitamin D deficiency, unspecified: E55.9

## 2014-06-19 HISTORY — DX: Benign prostatic hyperplasia without lower urinary tract symptoms: N40.0

## 2014-06-19 NOTE — Progress Notes (Signed)
Patient ID: Roger Berger L Roger Berger, male   DOB: Aug 26, 1945, 68 y.o.   MRN: [id number]  [facility]     No Known Allergies     Chief Complaint  Patient presents with  . Medical Management of Chronic Issues    HPI:  He is a long term resident of this facility being seen for the management of his chronic illnesses. Overall his status remains without change. He is not voicing any complaints or pain. There are no nursing concerns being voiced.    Past Medical History  Diagnosis Date  . Ataxia, late effect of cerebrovascular disease   . Aphasia, late effect of cerebrovascular disease   . Cerebral thrombosis   . Stroke   . Coronary artery disease   . Hypertension   . Diabetes mellitus     Past Surgical History  Procedure Laterality Date  . Coronary artery bypass graft      VITAL SIGNS BP 141/89 mmHg  Pulse 53  Ht 5\' 9"  (1.753 m)  Wt 162 lb (73.483 kg)  BMI 23.91 kg/m2  SpO2 98%   Outpatient Encounter Prescriptions as of 06/13/2014  Medication Sig  . acetaminophen (TYLENOL) 325 MG tablet Take 650 mg by mouth every 6 (six) hours as needed for mild pain, fever or headache.  Marland Kitchen. amLODipine (NORVASC) 10 MG tablet Take 10 mg by mouth daily.  Marland Kitchen. aspirin 81 MG chewable tablet Chew 81 mg by mouth daily.  Marland Kitchen. atorvastatin (LIPITOR) 10 MG tablet Take 10 mg by mouth daily.  . cilostazol (PLETAL) 100 MG tablet Take 100 mg by mouth daily.   . clopidogrel (PLAVIX) 75 MG tablet Take 1 tablet by mouth Daily.  . diclofenac sodium (VOLTAREN) 1 % GEL Apply 2 g topically every 6 (six) hours as needed. For pain to shoulder, use 4 g for pain to knees every 6 hours prn.  . docusate sodium (COLACE) 100 MG capsule Take 100 mg by mouth 2 (two) times daily.  . furosemide (LASIX) 40 MG tablet Take 40 mg by mouth daily.  . hydrALAZINE (APRESOLINE) 25 MG tablet Take 25 mg by mouth daily.   . Insulin Detemir (LEVEMIR FLEXPEN) 100 UNIT/ML Pen Inject 10 Units into the skin daily at 6 PM. For diabetes  .  insulin lispro (HUMALOG) 100 UNIT/ML injection Inject 5 Units into the skin 4 (four) times daily -  before meals and at bedtime. For blood sugar over 150  . metFORMIN (GLUCOPHAGE) 500 MG tablet Take 1,250 mg by mouth 2 (two) times daily with a meal.   . metoprolol (LOPRESSOR) 100 MG tablet Take 100 mg by mouth daily.  Marland Kitchen. oxybutynin (DITROPAN-XL) 5 MG 24 hr tablet Take 5 mg by mouth daily.   . pantoprazole (PROTONIX) 40 MG tablet Take 40 mg by mouth daily.  . ramipril (ALTACE) 10 MG capsule TAKE ONE CAPSULE BY MOUTH TWICE DAILY  . senna (SENOKOT) 8.6 MG tablet Take 1 tablet by mouth daily.  . sitaGLIPtin (JANUVIA) 100 MG tablet Take 100 mg by mouth daily.  . tamsulosin (FLOMAX) 0.4 MG CAPS capsule Take 0.4 mg by mouth daily.   . Vitamin D, Ergocalciferol, (DRISDOL) 50000 UNITS CAPS Take 50,000 Units by mouth every 7 (seven) days. Monday and Thursday  . [DISCONTINUED] promethazine (PHENERGAN) 25 MG/ML injection Inject 25 mg into the vein once.  . [DISCONTINUED] ranitidine (ZANTAC) 150 MG tablet Take 150 mg by mouth daily.     SIGNIFICANT DIAGNOSTIC EXAMS   LABS REVIEWED:   03-24-14: vitamin d 51.82  05-12-14: glucose 96; bun 22.6; creat 1.48; k+4.5; na++ 141; liver normal albumin 3.6; hgb a1c 6.0;  Chol 150; ldl 86; trig 61; urine micro-albumin 1.2    Review of Systems  Constitutional: Negative for malaise/fatigue.  Respiratory: Negative for cough and shortness of breath.   Cardiovascular: Negative for chest pain, palpitations and leg swelling.  Gastrointestinal: Negative for heartburn, abdominal pain and constipation.  Musculoskeletal: Negative for myalgias, back pain and joint pain.  Skin: Negative.   Neurological: Negative for headaches.  Psychiatric/Behavioral: Negative for depression. The patient is not nervous/anxious.      Physical Exam  Constitutional: He appears well-developed and well-nourished. No distress.  Neck: Neck supple. No JVD present. No thyromegaly present.    Cardiovascular: Normal rate, regular rhythm and intact distal pulses.   Respiratory: Effort normal and breath sounds normal. No respiratory distress.  GI: Soft. Bowel sounds are normal. He exhibits no distension. There is no tenderness.  Musculoskeletal: He exhibits no edema.  Is able to move all extremities   Neurological: He is alert.  Skin: Skin is warm and dry. He is not diaphoretic.       ASSESSMENT/ PLAN:   1. CVA: he is neurologically stable will continue his status 81 mg daily; plavix 75 mg daily; will not make changes will monitor   2. Hypertension: will have nursing check blood pressure twice daily for one week and report. Will continue altace 10 mg twice daily; norvasc 10 mg daily will continue hydralazine 25 mg daily will continue lopressor 100 mg daily and will monitor his status.   3. Diabetes: his hgb a1c is 6.0. Will continue levemir 10 units daily; humalog ac/hs for cbg >=150; januvia 100 mg daily and metformin 1250 mg twice daily and will monitor   4. Edema: is stable will continue lasix 40 mg daily and will monitor   5. Dyslipidemia: will continue lipitor 10 mg daily his ldl is 86  6. Osteoarthritis: will continue voltaren gel 2 gm to shoulder and 4 gm to knees every 6 hours as needed and will monitor   7. GERD: will continue protonix 40 mg daily   8. Vit d deficiency: will stop the weekly vit d and will monitor   9. BPH: will continue flomax daily and ditropan xl 5 mg daily for UI  10. PAD: he remains stable; no complaints of pain present; will continue pletal 100 mg dail and will monitor   11. Constipation: will continue colace twice daily and senna s daily      Roger Berger Dontreal Miera NP [facility]  Contact [phone] Monday through Friday 8am- 5pm  After hours call [phone]

## 2014-07-13 ENCOUNTER — Encounter: Payer: Self-pay | Admitting: Adult Health

## 2014-07-13 ENCOUNTER — Non-Acute Institutional Stay (SKILLED_NURSING_FACILITY): Payer: Medicare Other | Admitting: Adult Health

## 2014-07-13 DIAGNOSIS — I10 Essential (primary) hypertension: Secondary | ICD-10-CM

## 2014-07-13 DIAGNOSIS — E785 Hyperlipidemia, unspecified: Secondary | ICD-10-CM

## 2014-07-13 DIAGNOSIS — I639 Cerebral infarction, unspecified: Secondary | ICD-10-CM

## 2014-07-13 DIAGNOSIS — E1140 Type 2 diabetes mellitus with diabetic neuropathy, unspecified: Secondary | ICD-10-CM

## 2014-07-13 DIAGNOSIS — I739 Peripheral vascular disease, unspecified: Secondary | ICD-10-CM

## 2014-07-13 DIAGNOSIS — I2510 Atherosclerotic heart disease of native coronary artery without angina pectoris: Secondary | ICD-10-CM

## 2014-07-13 DIAGNOSIS — E1149 Type 2 diabetes mellitus with other diabetic neurological complication: Secondary | ICD-10-CM

## 2014-07-13 DIAGNOSIS — N400 Enlarged prostate without lower urinary tract symptoms: Secondary | ICD-10-CM

## 2014-07-13 NOTE — Progress Notes (Signed)
Patient ID: Roger Berger, male   DOB: 04/06/1946, 69 y.o.   MRN: [id number]  [facility]     No Known Allergies     Chief Complaint  Patient presents with  . Medical Management of Chronic Issues    HPI:  He is a long term resident of this facility being seen for the management of hit chronic illnesses. He is doing well. He states he feels good and is not voicing any complaints or concerns. There are no nursing concerns at this time.     Past Medical History  Diagnosis Date  . Ataxia, late effect of cerebrovascular disease   . Aphasia, late effect of cerebrovascular disease   . Cerebral thrombosis   . Stroke   . Coronary artery disease   . Hypertension   . Diabetes mellitus     Past Surgical History  Procedure Laterality Date  . Coronary artery bypass graft      VITAL SIGNS BP 122/66 mmHg  Pulse 61  Ht 5\' 9"  (1.753 m)  Wt 160 lb (72.576 kg)  BMI 23.62 kg/m2   Outpatient Encounter Prescriptions as of 07/13/2014  Medication Sig  . acetaminophen (TYLENOL) 325 MG tablet Take 650 mg by mouth every 6 (six) hours as needed for mild pain, fever or headache.  Marland Kitchen. amLODipine (NORVASC) 10 MG tablet Take 10 mg by mouth daily.  Marland Kitchen. aspirin 81 MG chewable tablet Chew 81 mg by mouth daily.  Marland Kitchen. atorvastatin (LIPITOR) 10 MG tablet Take 10 mg by mouth daily.  . cilostazol (PLETAL) 100 MG tablet Take 100 mg by mouth daily.   . clopidogrel (PLAVIX) 75 MG tablet Take 1 tablet by mouth Daily.  . diclofenac sodium (VOLTAREN) 1 % GEL Apply 2 g topically every 6 (six) hours as needed. For pain to shoulder, use 4 g for pain to knees every 6 hours prn.  . docusate sodium (COLACE) 100 MG capsule Take 100 mg by mouth 2 (two) times daily.  . furosemide (LASIX) 40 MG tablet Take 40 mg by mouth daily.  . hydrALAZINE (APRESOLINE) 25 MG tablet Take 25 mg by mouth daily.   . Insulin Detemir (LEVEMIR FLEXPEN) 100 UNIT/ML Pen Inject 10 Units into the skin daily at 6 PM. For diabetes  . insulin  lispro (HUMALOG) 100 UNIT/ML injection Inject 5 Units into the skin 4 (four) times daily -  before meals and at bedtime. For blood sugar over 150  . metFORMIN (GLUCOPHAGE) 500 MG tablet Take 1,250 mg by mouth 2 (two) times daily with a meal.   . metoprolol (LOPRESSOR) 100 MG tablet Take 100 mg by mouth daily.  Marland Kitchen. oxybutynin (DITROPAN-XL) 5 MG 24 hr tablet Take 5 mg by mouth daily.   . pantoprazole (PROTONIX) 40 MG tablet Take 40 mg by mouth daily.  . ramipril (ALTACE) 10 MG capsule TAKE ONE CAPSULE BY MOUTH TWICE DAILY  . senna (SENOKOT) 8.6 MG tablet Take 1 tablet by mouth daily.  . sitaGLIPtin (JANUVIA) 100 MG tablet Take 100 mg by mouth daily.  . tamsulosin (FLOMAX) 0.4 MG CAPS capsule Take 0.4 mg by mouth daily.      SIGNIFICANT DIAGNOSTIC EXAMS   LABS REVIEWED:   03-24-14: vitamin d 51.82  05-12-14: glucose 96; bun 22.6; creat 1.48; k+4.5; na++ 141; liver normal albumin 3.6; hgb a1c 6.0;  Chol 150; ldl 86; trig 61; urine micro-albumin 1.2     ROS Constitutional: Negative for malaise/fatigue.  Respiratory: Negative for cough and shortness of breath.   Cardiovascular:  Negative for chest pain, palpitations and leg swelling.  Gastrointestinal: Negative for heartburn, abdominal pain and constipation.  Musculoskeletal: Negative for myalgias, back pain and joint pain.  Skin: Negative.   Neurological: Negative for headaches.  Psychiatric/Behavioral: Negative for depression. The patient is not nervous/anxious.       Physical Exam  Constitutional: He appears well-developed and well-nourished. No distress.  Neck: Neck supple. No JVD present. No thyromegaly present.  Cardiovascular: Normal rate, regular rhythm and intact distal pulses.   Respiratory: Effort normal and breath sounds normal. No respiratory distress.  GI: Soft. Bowel sounds are normal. He exhibits no distension. There is no tenderness.  Musculoskeletal: He exhibits no edema.  Is able to move all extremities     Neurological: He is alert.  Skin: Skin is warm and dry. He is not diaphoretic.      ASSESSMENT/ PLAN:   1. CVA: he is neurologically stable will continue his status 81 mg daily; plavix 75 mg daily; will not make changes will monitor   2. Hypertension: will have nursing check blood pressure twice daily for one week and report. Will continue altace 10 mg twice daily; norvasc 10 mg daily will continue hydralazine 25 mg daily will continue lopressor 100 mg daily and will monitor his status.   3. Diabetes: his hgb a1c is 6.0. Will continue levemir 10 units daily; humalog ac/hs for cbg >=150; januvia 100 mg daily and metformin 1250 mg twice daily and will monitor  Is on ace; statin; asa.   4. Edema: is stable will continue lasix 40 mg daily and will monitor   5. Dyslipidemia: will continue lipitor 10 mg daily his ldl is 86  6. Osteoarthritis: will continue voltaren gel 2 gm to shoulder and 4 gm to knees every 6 hours as needed and will monitor   7. GERD: will continue protonix 40 mg daily   8. BPH: will continue flomax daily and ditropan xl 5 mg daily for UI  9. PAD: he remains stable; no complaints of pain present; will continue pletal 100 mg daily and will monitor   10.. Constipation: will continue colace twice daily and senna  daily     Synthia Innocent NP [facility]  Contact [phone] Monday through Friday 8am- 5pm  After hours call [phone]

## 2014-08-12 ENCOUNTER — Encounter: Payer: Self-pay | Admitting: Adult Health

## 2014-08-12 ENCOUNTER — Non-Acute Institutional Stay (SKILLED_NURSING_FACILITY): Payer: Medicare Other | Admitting: Adult Health

## 2014-08-12 DIAGNOSIS — E1140 Type 2 diabetes mellitus with diabetic neuropathy, unspecified: Secondary | ICD-10-CM

## 2014-08-12 DIAGNOSIS — K219 Gastro-esophageal reflux disease without esophagitis: Secondary | ICD-10-CM

## 2014-08-12 DIAGNOSIS — E785 Hyperlipidemia, unspecified: Secondary | ICD-10-CM

## 2014-08-12 DIAGNOSIS — I10 Essential (primary) hypertension: Secondary | ICD-10-CM

## 2014-08-12 DIAGNOSIS — M170 Bilateral primary osteoarthritis of knee: Secondary | ICD-10-CM

## 2014-08-12 DIAGNOSIS — I639 Cerebral infarction, unspecified: Secondary | ICD-10-CM

## 2014-08-12 DIAGNOSIS — E1149 Type 2 diabetes mellitus with other diabetic neurological complication: Secondary | ICD-10-CM

## 2014-08-12 DIAGNOSIS — Z951 Presence of aortocoronary bypass graft: Secondary | ICD-10-CM

## 2014-08-12 DIAGNOSIS — N400 Enlarged prostate without lower urinary tract symptoms: Secondary | ICD-10-CM

## 2014-08-12 DIAGNOSIS — I25810 Atherosclerosis of coronary artery bypass graft(s) without angina pectoris: Secondary | ICD-10-CM

## 2014-08-12 DIAGNOSIS — G8110 Spastic hemiplegia affecting unspecified side: Secondary | ICD-10-CM

## 2014-08-12 NOTE — Progress Notes (Signed)
Patient ID: Roger Berger, male   DOB: Sep 07, 1945, 69 y.o.   MRN: [id number]  [facility]     No Known Allergies     Chief Complaint  Patient presents with  . Medical Management of Chronic Issues    HPI:  He is a long term resident of this facility being seen for the management of his chronic illnesses. overall his status remains stable. He states that he is feeling good and does not have any complaints or concerns. There are no nursing concerns today.    Past Medical History  Diagnosis Date  . Ataxia, late effect of cerebrovascular disease   . Aphasia, late effect of cerebrovascular disease   . Cerebral thrombosis   . Stroke   . Coronary artery disease   . Hypertension   . Diabetes mellitus   . Vitamin D deficiency 06/19/2014    Past Surgical History  Procedure Laterality Date  . Coronary artery bypass graft      VITAL SIGNS BP 144/59 mmHg  Pulse 81  Ht  (1.753 m)  Wt 160 lb (72.576 kg)  BMI 23.62 kg/m2  SpO2 99%   Outpatient Encounter Prescriptions as of 08/12/2014  Medication Sig  . acetaminophen (TYLENOL) 325 MG tablet Take 650 mg by mouth every 6 (six) hours as needed for mild pain, fever or headache.  Marland Kitchen amLODipine (NORVASC) 10 MG tablet Take 10 mg by mouth daily.  Marland Kitchen aspirin 81 MG chewable tablet Chew 81 mg by mouth daily.  Marland Kitchen atorvastatin (LIPITOR) 10 MG tablet Take 10 mg by mouth daily.  . cilostazol (PLETAL) 100 MG tablet Take 100 mg by mouth daily.   . clopidogrel (PLAVIX) 75 MG tablet Take 1 tablet by mouth Daily.  . diclofenac sodium (VOLTAREN) 1 % GEL Apply 2 g topically every 6 (six) hours as needed. For pain to shoulder, use 4 g for pain to knees every 6 hours prn.  . docusate sodium (COLACE) 100 MG capsule Take 100 mg by mouth 2 (two) times daily.  . furosemide (LASIX) 40 MG tablet Take 40 mg by mouth daily.  . hydrALAZINE (APRESOLINE) 25 MG tablet Take 25 mg by mouth daily.   . Insulin Detemir (LEVEMIR FLEXPEN) 100 UNIT/ML Pen Inject 10  Units into the skin daily at 6 PM. For diabetes  . insulin lispro (HUMALOG) 100 UNIT/ML injection Inject 5 Units into the skin 4 (four) times daily -  before meals and at bedtime. For blood sugar over 150  . metFORMIN (GLUCOPHAGE) 500 MG tablet Take 1,250 mg by mouth 2 (two) times daily with a meal.   . metoprolol (LOPRESSOR) 100 MG tablet Take 100 mg by mouth daily.  Marland Kitchen oxybutynin (DITROPAN-XL) 5 MG 24 hr tablet Take 5 mg by mouth daily.   . pantoprazole (PROTONIX) 40 MG tablet Take 40 mg by mouth daily.  . ramipril (ALTACE) 10 MG capsule TAKE ONE CAPSULE BY MOUTH TWICE DAILY  . senna (SENOKOT) 8.6 MG tablet Take 1 tablet by mouth daily.  . sitaGLIPtin (JANUVIA) 100 MG tablet Take 100 mg by mouth daily.  . tamsulosin (FLOMAX) 0.4 MG CAPS capsule Take 0.4 mg by mouth daily.      SIGNIFICANT DIAGNOSTIC EXAMS   LABS REVIEWED:   03-24-14: vitamin d 51.82  05-12-14: glucose 96; bun 22.6; creat 1.48; k+4.5; na++ 141; liver normal albumin 3.6; hgb a1c 6.0;  Chol 150; ldl 86; trig 61; urine micro-albumin 1.2      ROS Constitutional: Negative for malaise/fatigue.  Respiratory:  Negative for cough and shortness of breath.   Cardiovascular: Negative for chest pain, palpitations and leg swelling.  Gastrointestinal: Negative for heartburn, abdominal pain and constipation.  Musculoskeletal: Negative for myalgias, back pain and joint pain.  Skin: Negative.   Neurological: Negative for headaches.  Psychiatric/Behavioral: Negative for depression. The patient is not nervous/anxious.      Physical Exam Constitutional: He appears well-developed and well-nourished. No distress.  Neck: Neck supple. No JVD present. No thyromegaly present.  Cardiovascular: Normal rate, regular rhythm and intact distal pulses.   Respiratory: Effort normal and breath sounds normal. No respiratory distress.  GI: Soft. Bowel sounds are normal. He exhibits no distension. There is no tenderness.  Musculoskeletal: He  exhibits no edema.  Is able to move all extremities   Neurological: He is alert.  Skin: Skin is warm and dry. He is not diaphoretic.    ASSESSMENT/ PLAN:   1. CVA: he is neurologically stable will continue his status 81 mg daily; plavix 75 mg daily; will not make changes will monitor   2. Hypertension:  Will continue altace 10 mg twice daily; norvasc 10 mg daily will continue hydralazine 25 mg daily will continue lopressor 100 mg daily and will monitor his status.   3. Diabetes: his hgb a1c is 6.0. Will continue levemir 10 units daily; humalog ac/hs for cbg >=150; januvia 100 mg daily and metformin 1250 mg twice daily and will monitor  Is on ace; statin; asa.   4. Edema: is stable will continue lasix 40 mg daily and will monitor   5. Dyslipidemia: will continue lipitor 10 mg daily his ldl is 86  6. Osteoarthritis: will continue voltaren gel 2 gm to shoulder and 4 gm to knees every 6 hours as needed and will monitor   7. GERD: will continue protonix 40 mg daily   8. BPH: will continue flomax daily and ditropan xl 5 mg daily for UI  9. PAD: he remains stable; no complaints of pain present; will continue pletal 100 mg daily and will monitor   10.. Constipation: will continue colace twice daily and senna  daily     Synthia Innocenteborah Green NP [facility]  Contact [phone] Monday through Friday 8am- 5pm  After hours call [phone]

## 2014-09-09 LAB — LIPID PANEL
CHOLESTEROL: 123 mg/dL (ref 0–200)
HDL: 36 mg/dL (ref 35–70)
LDL Cholesterol: 72 mg/dL
Triglycerides: 79 mg/dL (ref 40–160)

## 2014-09-09 LAB — BASIC METABOLIC PANEL
BUN: 13 mg/dL (ref 4–21)
CREATININE: 1 mg/dL (ref <=1.3)
Glucose: 82 mg/dL
Sodium: 150 mmol/L — AB (ref 137–147)

## 2014-09-09 LAB — HEPATIC FUNCTION PANEL: ALT: 7 U/L — AB (ref 10–40)

## 2014-09-09 LAB — HEMOGLOBIN A1C: HEMOGLOBIN A1C: 5.9 % (ref 4.0–6.0)

## 2014-09-21 ENCOUNTER — Non-Acute Institutional Stay (SKILLED_NURSING_FACILITY): Payer: Medicare Other | Admitting: Internal Medicine

## 2014-09-21 DIAGNOSIS — I2510 Atherosclerotic heart disease of native coronary artery without angina pectoris: Secondary | ICD-10-CM

## 2014-09-21 DIAGNOSIS — I739 Peripheral vascular disease, unspecified: Secondary | ICD-10-CM

## 2014-09-21 DIAGNOSIS — I10 Essential (primary) hypertension: Secondary | ICD-10-CM

## 2014-09-21 DIAGNOSIS — E1165 Type 2 diabetes mellitus with hyperglycemia: Secondary | ICD-10-CM

## 2014-09-21 DIAGNOSIS — E785 Hyperlipidemia, unspecified: Secondary | ICD-10-CM | POA: Diagnosis not present

## 2014-09-21 DIAGNOSIS — I639 Cerebral infarction, unspecified: Secondary | ICD-10-CM

## 2014-09-21 DIAGNOSIS — IMO0002 Reserved for concepts with insufficient information to code with codable children: Secondary | ICD-10-CM

## 2014-10-12 ENCOUNTER — Encounter: Payer: Self-pay | Admitting: Internal Medicine

## 2014-10-12 DIAGNOSIS — E1139 Type 2 diabetes mellitus with other diabetic ophthalmic complication: Secondary | ICD-10-CM | POA: Insufficient documentation

## 2014-10-12 DIAGNOSIS — IMO0002 Reserved for concepts with insufficient information to code with codable children: Secondary | ICD-10-CM | POA: Insufficient documentation

## 2014-10-12 DIAGNOSIS — E1165 Type 2 diabetes mellitus with hyperglycemia: Secondary | ICD-10-CM

## 2014-10-12 NOTE — Progress Notes (Signed)
Patient ID: Roger Berger, male   DOB: [birth year], 69 y.o.   MRN: [id number]    Facility  [facility]    Place of Service:   SNF   No Known Allergies  Chief Complaint  Patient presents with  . Medical Management of Chronic Issues    HPI:  69 yo male long term resident seen today for f/u. He c/o menu choices and he would like to s/w dietary about developing a list of choice foods.  He otherwise is feeling well. Appetite is excellent and he is sleeping well. His son is present.  Past Medical History  Diagnosis Date  . Ataxia, late effect of cerebrovascular disease   . Aphasia, late effect of cerebrovascular disease   . Cerebral thrombosis   . Stroke - on plavix/ASA/lipitor   . Coronary artery disease - on ASA, BB and altace   . Hypertension - stable on lasix, lopressor, norvasc, hydralazine and altace   . Diabetes mellitus - CBGs 70-100s. No low BS reactions. No numbness/tingling. Taking metformin, januvia and levemir. At last eye exam in Feb 2016, (+) diabetic retinopathy noted. Podiatry eval in Jan 2016 neg for peripheral neuropathy   . Vitamin D deficiency 06/19/2014   PAD stable on pletal.  OA controlled on voltaren gel and Tylenol  He has BPH and takes flomax, ditropan xl.  GERD controlled with protonix  Past Surgical History  Procedure Laterality Date  . Coronary artery bypass graft     History   Social History  . Marital Status: Married    Spouse Name: N/A  . Number of Children: N/A  . Years of Education: N/A   Social History Main Topics  . Smoking status: Former Games developer  . Smokeless tobacco: Never Used     Comment: quit about 30 years ago.  . Alcohol Use: No  . Drug Use: Not on file  . Sexual Activity: Not on file   Other Topics Concern  . Not on file   Social History Narrative    Medications: Patient's Medications  New Prescriptions   No medications on file  Previous Medications   ACETAMINOPHEN (TYLENOL) 325 MG TABLET    Take  650 mg by mouth every 6 (six) hours as needed for mild pain, fever or headache.   AMLODIPINE (NORVASC) 10 MG TABLET    Take 10 mg by mouth daily.   ASPIRIN 81 MG CHEWABLE TABLET    Chew 81 mg by mouth daily.   ATORVASTATIN (LIPITOR) 10 MG TABLET    Take 10 mg by mouth daily.   CILOSTAZOL (PLETAL) 100 MG TABLET    Take 100 mg by mouth daily.    CLOPIDOGREL (PLAVIX) 75 MG TABLET    Take 1 tablet by mouth Daily.   DICLOFENAC SODIUM (VOLTAREN) 1 % GEL    Apply 2 g topically every 6 (six) hours as needed. For pain to shoulder, use 4 g for pain to knees every 6 hours prn.   DOCUSATE SODIUM (COLACE) 100 MG CAPSULE    Take 100 mg by mouth 2 (two) times daily.   FUROSEMIDE (LASIX) 40 MG TABLET    Take 40 mg by mouth daily.   HYDRALAZINE (APRESOLINE) 25 MG TABLET    Take 25 mg by mouth daily.    INSULIN DETEMIR (LEVEMIR FLEXPEN) 100 UNIT/ML PEN    Inject 10 Units into the skin daily at 6 PM. For diabetes   INSULIN LISPRO (HUMALOG) 100 UNIT/ML INJECTION    Inject 5 Units  into the skin 4 (four) times daily -  before meals and at bedtime. For blood sugar over 150   METFORMIN (GLUCOPHAGE) 500 MG TABLET    Take 1,250 mg by mouth 2 (two) times daily with a meal.    METOPROLOL (LOPRESSOR) 100 MG TABLET    Take 100 mg by mouth daily.   OXYBUTYNIN (DITROPAN-XL) 5 MG 24 HR TABLET    Take 5 mg by mouth daily.    PANTOPRAZOLE (PROTONIX) 40 MG TABLET    Take 40 mg by mouth daily.   RAMIPRIL (ALTACE) 10 MG CAPSULE    TAKE ONE CAPSULE BY MOUTH TWICE DAILY   SENNA (SENOKOT) 8.6 MG TABLET    Take 1 tablet by mouth daily.   SITAGLIPTIN (JANUVIA) 100 MG TABLET    Take 100 mg by mouth daily.   TAMSULOSIN (FLOMAX) 0.4 MG CAPS CAPSULE    Take 0.4 mg by mouth daily.   Modified Medications   No medications on file  Discontinued Medications   No medications on file     Review of Systems  Constitutional: Negative for chills, activity change, appetite change and fatigue.  HENT: Negative for sore throat and trouble  swallowing.   Eyes: Negative for visual disturbance.  Respiratory: Negative for cough, chest tightness and shortness of breath.   Cardiovascular: Negative for chest pain, palpitations and leg swelling.  Gastrointestinal: Negative for nausea, vomiting, abdominal pain and blood in stool.  Genitourinary: Negative for urgency, frequency and difficulty urinating.  Musculoskeletal: Positive for arthralgias. Negative for gait problem.  Skin: Negative for rash.  Neurological: Positive for weakness. Negative for headaches.  Psychiatric/Behavioral: Negative for confusion and sleep disturbance. The patient is not nervous/anxious.     Filed Vitals:   09/08/14 2054  BP: 148/74  Pulse: 76  Temp: 97.6 F (36.4 C)  SpO2: 98%   There is no weight on file to calculate BMI.  Physical Exam  Constitutional: He is oriented to person, place, and time. He appears well-developed and well-nourished.  HENT:  Mouth/Throat: Oropharynx is clear and moist.  Eyes: Pupils are equal, round, and reactive to light. No scleral icterus.  Neck: Neck supple. No thyromegaly present.  Cardiovascular: Normal rate, regular rhythm, normal heart sounds and intact distal pulses.  Exam reveals no gallop and no friction rub.   No murmur heard. No carotid bruit b/l; no distal LE swelling  Pulmonary/Chest: Effort normal and breath sounds normal. He has no wheezes. He has no rales. He exhibits no tenderness.  Abdominal: Soft. Bowel sounds are normal. He exhibits no distension, no abdominal bruit, no pulsatile midline mass and no mass. There is no tenderness. There is no rebound and no guarding.  Musculoskeletal: He exhibits edema and tenderness.  Lymphadenopathy:    He has no cervical adenopathy.  Neurological: He is alert and oriented to person, place, and time.  Skin: Skin is warm and dry. No rash noted.  Psychiatric: He has a normal mood and affect. His behavior is normal. Judgment and thought content normal.     Labs  reviewed: Nursing Home on 08/12/2014  Component Date Value Ref Range Status  . Microalb, Ur 05/12/2014 1.2   Final  Nursing Home on 07/13/2014  Component Date Value Ref Range Status  . Glucose 05/12/2014 96   Final  . BUN 05/12/2014 23* 4 - 21 mg/dL Final  . Creatinine 16/04/9603 1.5* 0.6 - 1.3 mg/dL Final  . Potassium 54/03/8118 4.5  3.4 - 5.3 mmol/L Final  . Sodium 05/12/2014  141  137 - 147 mmol/L Final  . Triglycerides 05/12/2014 61  40 - 160 mg/dL Final  . Cholesterol 16/10/960411/11/2013 150  0 - 200 mg/dL Final  . HDL 54/09/811911/11/2013 51  35 - 70 mg/dL Final  . LDL Cholesterol 05/12/2014 86   Final  . Alkaline Phosphatase 05/12/2014 64  25 - 125 U/L Final  . ALT 05/12/2014 13  10 - 40 U/L Final  . AST 05/12/2014 15  14 - 40 U/L Final  . Bilirubin, Total 05/12/2014 0.3   Final  . Hgb A1c MFr Bld 05/12/2014 6.0  4.0 - 6.0 % Final     Assessment/Plan   ICD-9-CM ICD-10-CM   1. Type 2 diabetes mellitus, uncontrolled - cont levemir, januvia and metformin 250.02 E11.65   2. Essential hypertension - stable on meds 401.9 I10   3. Stroke - stable on ASA/plavix 434.91 I63.9   4. Coronary artery disease involving native coronary artery of native heart without angina pectoris - stable on BB, ACE (-) and lasix 414.01 I25.10   5. Hyperlipidemia - stable on lipitor 272.4 E78.5   6. PAD (peripheral artery disease) - stable on pletal 443.9 I73.9     --dietary eval to discuss preferred foods  --check CMP, lipid panel and A1c  --f/u with eye specialist and podiatry as indicated  --cont current medications as ordered  --continue CBG monitoring  --will follow   Tonjua Rossetti S. Ancil Linseyarter, D. O., F. A. C. O. I.  [facility] [address] [place], [place] [address] [phone] Office (Wednesdays and Fridays 8 AM - 5 PM) [phone] Cell (Monday-Friday 8 AM - 5 PM)

## 2014-10-19 ENCOUNTER — Encounter: Payer: Self-pay | Admitting: Adult Health

## 2014-10-19 ENCOUNTER — Non-Acute Institutional Stay (SKILLED_NURSING_FACILITY): Payer: Medicare Other | Admitting: Adult Health

## 2014-10-19 DIAGNOSIS — M170 Bilateral primary osteoarthritis of knee: Secondary | ICD-10-CM

## 2014-10-19 DIAGNOSIS — N400 Enlarged prostate without lower urinary tract symptoms: Secondary | ICD-10-CM

## 2014-10-19 DIAGNOSIS — E785 Hyperlipidemia, unspecified: Secondary | ICD-10-CM | POA: Diagnosis not present

## 2014-10-19 DIAGNOSIS — I639 Cerebral infarction, unspecified: Secondary | ICD-10-CM | POA: Diagnosis not present

## 2014-10-19 DIAGNOSIS — K219 Gastro-esophageal reflux disease without esophagitis: Secondary | ICD-10-CM

## 2014-10-19 DIAGNOSIS — I10 Essential (primary) hypertension: Secondary | ICD-10-CM

## 2014-10-19 DIAGNOSIS — K5900 Constipation, unspecified: Secondary | ICD-10-CM

## 2014-10-19 DIAGNOSIS — E1149 Type 2 diabetes mellitus with other diabetic neurological complication: Secondary | ICD-10-CM

## 2014-10-19 DIAGNOSIS — E1140 Type 2 diabetes mellitus with diabetic neuropathy, unspecified: Secondary | ICD-10-CM | POA: Diagnosis not present

## 2014-10-19 DIAGNOSIS — K5909 Other constipation: Secondary | ICD-10-CM

## 2014-10-20 LAB — CBC AND DIFFERENTIAL
Hemoglobin: 10.8 g/dL — AB (ref 13.5–17.5)
WBC: 5.7 10^3/mL

## 2014-10-26 ENCOUNTER — Encounter (HOSPITAL_COMMUNITY): Payer: Self-pay | Admitting: Emergency Medicine

## 2014-10-26 ENCOUNTER — Emergency Department (HOSPITAL_COMMUNITY)
Admission: EM | Admit: 2014-10-26 | Discharge: 2014-10-27 | Disposition: A | Discharge status: Home / Self Care | Payer: Medicare Other | Attending: Emergency Medicine | Admitting: Emergency Medicine

## 2014-10-26 ENCOUNTER — Emergency Department (HOSPITAL_COMMUNITY): Payer: Medicare Other

## 2014-10-26 DIAGNOSIS — R29810 Facial weakness: Secondary | ICD-10-CM | POA: Insufficient documentation

## 2014-10-26 DIAGNOSIS — I2510 Atherosclerotic heart disease of native coronary artery without angina pectoris: Secondary | ICD-10-CM | POA: Insufficient documentation

## 2014-10-26 DIAGNOSIS — Z951 Presence of aortocoronary bypass graft: Secondary | ICD-10-CM | POA: Diagnosis not present

## 2014-10-26 DIAGNOSIS — R110 Nausea: Secondary | ICD-10-CM | POA: Diagnosis not present

## 2014-10-26 DIAGNOSIS — N390 Urinary tract infection, site not specified: Secondary | ICD-10-CM | POA: Insufficient documentation

## 2014-10-26 DIAGNOSIS — R1084 Generalized abdominal pain: Secondary | ICD-10-CM | POA: Insufficient documentation

## 2014-10-26 DIAGNOSIS — Z8673 Personal history of transient ischemic attack (TIA), and cerebral infarction without residual deficits: Secondary | ICD-10-CM | POA: Diagnosis not present

## 2014-10-26 DIAGNOSIS — E119 Type 2 diabetes mellitus without complications: Secondary | ICD-10-CM | POA: Insufficient documentation

## 2014-10-26 DIAGNOSIS — Z87891 Personal history of nicotine dependence: Secondary | ICD-10-CM | POA: Insufficient documentation

## 2014-10-26 DIAGNOSIS — I10 Essential (primary) hypertension: Secondary | ICD-10-CM | POA: Insufficient documentation

## 2014-10-26 DIAGNOSIS — R531 Weakness: Secondary | ICD-10-CM

## 2014-10-26 DIAGNOSIS — M6281 Muscle weakness (generalized): Secondary | ICD-10-CM | POA: Diagnosis not present

## 2014-10-26 LAB — URINALYSIS, ROUTINE W REFLEX MICROSCOPIC
Bilirubin Urine: NEGATIVE
GLUCOSE, UA: 500 mg/dL — AB
Ketones, ur: 15 mg/dL — AB
Nitrite: NEGATIVE
Protein, ur: 30 mg/dL — AB
Specific Gravity, Urine: 1.016 (ref 1.005–1.030)
Urobilinogen, UA: 0.2 mg/dL (ref 0.0–1.0)
pH: 5 (ref 5.0–8.0)

## 2014-10-26 LAB — CBC WITH DIFFERENTIAL/PLATELET
Basophils Absolute: 0 10*3/uL (ref 0.0–0.1)
Basophils Relative: 0 % (ref 0–1)
EOS PCT: 0 % (ref 0–5)
Eosinophils Absolute: 0 10*3/uL (ref 0.0–0.7)
HEMATOCRIT: 31.8 % — AB (ref 39.0–52.0)
Hemoglobin: 10.8 g/dL — ABNORMAL LOW (ref 13.0–17.0)
LYMPHS ABS: 1.2 10*3/uL (ref 0.7–4.0)
LYMPHS PCT: 12 % (ref 12–46)
MCH: 31.6 pg (ref 26.0–34.0)
MCHC: 34 g/dL (ref 30.0–36.0)
MCV: 93 fL (ref 78.0–100.0)
MONO ABS: 0.7 10*3/uL (ref 0.1–1.0)
Monocytes Relative: 7 % (ref 3–12)
Neutro Abs: 8.2 10*3/uL — ABNORMAL HIGH (ref 1.7–7.7)
Neutrophils Relative %: 81 % — ABNORMAL HIGH (ref 43–77)
Platelets: 222 10*3/uL (ref 150–400)
RBC: 3.42 MIL/uL — AB (ref 4.22–5.81)
RDW: 13.8 % (ref 11.5–15.5)
WBC: 10 10*3/uL (ref 4.0–10.5)

## 2014-10-26 LAB — LIPASE, BLOOD: Lipase: 23 U/L (ref 11–59)

## 2014-10-26 LAB — COMPREHENSIVE METABOLIC PANEL
ALT: 11 U/L (ref 0–53)
AST: 15 U/L (ref 0–37)
Albumin: 3.3 g/dL — ABNORMAL LOW (ref 3.5–5.2)
Alkaline Phosphatase: 68 U/L (ref 39–117)
Anion gap: 11 (ref 5–15)
BUN: 13 mg/dL (ref 6–23)
CHLORIDE: 100 mmol/L (ref 96–112)
CO2: 26 mmol/L (ref 19–32)
Calcium: 9.1 mg/dL (ref 8.4–10.5)
Creatinine, Ser: 1.22 mg/dL (ref 0.50–1.35)
GFR calc Af Amer: 67 mL/min — ABNORMAL LOW (ref >90)
GFR calc non Af Amer: 58 mL/min — ABNORMAL LOW (ref >90)
Glucose, Bld: 192 mg/dL — ABNORMAL HIGH (ref 70–99)
Potassium: 3.7 mmol/L (ref 3.5–5.1)
Sodium: 137 mmol/L (ref 135–145)
Total Bilirubin: 0.6 mg/dL (ref 0.3–1.2)
Total Protein: 6.9 g/dL (ref 6.0–8.3)

## 2014-10-26 LAB — URINE MICROSCOPIC-ADD ON

## 2014-10-26 LAB — RAPID URINE DRUG SCREEN, HOSP PERFORMED
Amphetamines: NOT DETECTED
BENZODIAZEPINES: NOT DETECTED
Barbiturates: NOT DETECTED
COCAINE: NOT DETECTED
Opiates: NOT DETECTED
Tetrahydrocannabinol: NOT DETECTED

## 2014-10-26 LAB — I-STAT TROPONIN, ED: Troponin i, poc: 0.01 ng/mL (ref 0.00–0.08)

## 2014-10-26 LAB — PROTIME-INR
INR: 1.12 (ref 0.00–1.49)
PROTHROMBIN TIME: 14.5 s (ref 11.6–15.2)

## 2014-10-26 MED ORDER — CEPHALEXIN 500 MG PO CAPS: 500.0000 mg | ORAL_CAPSULE | Freq: Four times a day (QID) | ORAL | Status: DC

## 2014-10-26 MED ORDER — ONDANSETRON HCL 4 MG/2ML IJ SOLN: 4.0000 mg | Freq: Once | INTRAMUSCULAR | Status: DC

## 2014-10-26 MED ORDER — ONDANSETRON HCL 4 MG PO TABS: 4.0000 mg | ORAL_TABLET | Freq: Three times a day (TID) | ORAL | Status: AC | PRN

## 2014-10-26 NOTE — Discharge Instructions (Signed)

## 2014-10-26 NOTE — ED Provider Notes (Signed)
CSN: [id number]     Arrival date & time 10/26/14  2034 History   First MD Initiated Contact with Patient 10/26/14 2034     Chief Complaint  Patient presents with  . Weakness   (Consider location/radiation/quality/duration/timing/severity/associated sxs/prior Treatment) Patient is a 69 y.o. male presenting with Acute Neurological Problem. The history is provided by the patient. No language interpreter was used.  Cerebrovascular Accident This is a recurrent problem. The current episode started yesterday. The problem has been unchanged. Associated symptoms include abdominal pain, fatigue, a fever, nausea and weakness. Pertinent negatives include no arthralgias, change in bowel habit, chest pain, chills, congestion, coughing, diaphoresis, headaches, numbness or vomiting. Nothing aggravates the symptoms. He has tried nothing for the symptoms.    Past Medical History  Diagnosis Date  . Ataxia, late effect of cerebrovascular disease   . Aphasia, late effect of cerebrovascular disease   . Cerebral thrombosis   . Stroke   . Coronary artery disease   . Hypertension   . Diabetes mellitus   . Vitamin D deficiency 06/19/2014   Past Surgical History  Procedure Laterality Date  . Coronary artery bypass graft     Family History  Problem Relation Age of Onset  . Heart disease Father    History  Substance Use Topics  . Smoking status: Former Games developer  . Smokeless tobacco: Never Used     Comment: quit about 30 years ago.  . Alcohol Use: No    Review of Systems  Constitutional: Positive for fever and fatigue. Negative for chills and diaphoresis.  HENT: Negative for congestion.   Respiratory: Negative for cough, chest tightness, shortness of breath and wheezing.   Cardiovascular: Negative for chest pain.  Gastrointestinal: Positive for nausea and abdominal pain. Negative for vomiting and change in bowel habit.  Musculoskeletal: Negative for arthralgias.  Neurological: Positive for weakness.  Negative for facial asymmetry, light-headedness, numbness and headaches.  Psychiatric/Behavioral: Negative for confusion.  All other systems reviewed and are negative.     Allergies  Review of patient's allergies indicates no known allergies.  Home Medications   Prior to Admission medications   Medication Sig Start Date End Date Taking? Authorizing Provider  acetaminophen (TYLENOL) 325 MG tablet Take 650 mg by mouth every 6 (six) hours as needed for mild pain, fever or headache.   Yes Historical Provider, MD  amLODipine (NORVASC) 10 MG tablet Take 10 mg by mouth daily.   Yes Historical Provider, MD  aspirin 81 MG chewable tablet Chew 81 mg by mouth daily.   Yes Historical Provider, MD  atorvastatin (LIPITOR) 10 MG tablet Take 10 mg by mouth daily.   Yes Historical Provider, MD  cilostazol (PLETAL) 100 MG tablet Take 100 mg by mouth daily.    Yes Historical Provider, MD  clopidogrel (PLAVIX) 75 MG tablet Take 1 tablet by mouth Daily. 05/25/12  Yes Laurena Slimmer, MD  diclofenac sodium (VOLTAREN) 1 % GEL Apply 2 g topically every 6 (six) hours as needed. For pain to shoulder, use 4 g for pain to knees every 6 hours prn.   Yes Historical Provider, MD  docusate sodium (COLACE) 100 MG capsule Take 100 mg by mouth 2 (two) times daily.   Yes Historical Provider, MD  furosemide (LASIX) 40 MG tablet Take 40 mg by mouth daily.   Yes Historical Provider, MD  hydrALAZINE (APRESOLINE) 25 MG tablet Take 25 mg by mouth daily.    Yes Historical Provider, MD  Insulin Detemir (LEVEMIR FLEXPEN) 100 UNIT/ML Pen  Inject 10 Units into the skin daily at 6 PM. For diabetes   Yes Historical Provider, MD  metFORMIN (GLUCOPHAGE) 500 MG tablet Take 1,250 mg by mouth 2 (two) times daily with a meal.    Yes Historical Provider, MD  metoprolol (LOPRESSOR) 100 MG tablet Take 100 mg by mouth daily.   Yes Historical Provider, MD  oxybutynin (DITROPAN-XL) 5 MG 24 hr tablet Take 5 mg by mouth daily.  04/07/13  Yes Historical  Provider, MD  pantoprazole (PROTONIX) 40 MG tablet Take 40 mg by mouth daily.   Yes Historical Provider, MD  ramipril (ALTACE) 10 MG capsule TAKE ONE CAPSULE BY MOUTH TWICE DAILY 12/31/12  Yes Chrystie NoseKenneth C Hilty, MD  sitaGLIPtin (JANUVIA) 100 MG tablet Take 100 mg by mouth daily.   Yes Historical Provider, MD  tamsulosin (FLOMAX) 0.4 MG CAPS capsule Take 0.4 mg by mouth daily.  04/07/13  Yes Historical Provider, MD  valACYclovir (VALTREX) 1000 MG tablet Take 1,000 mg by mouth 2 (two) times daily. For 2 days then give 1000 mg twice daily for 2 days then stop   Yes Historical Provider, MD  cephALEXin (KEFLEX) 500 MG capsule Take 1 capsule (500 mg total) by mouth 4 (four) times daily. 10/26/14   Lenell AntuJamie Nasirah Sachs, MD  insulin lispro (HUMALOG) 100 UNIT/ML injection Inject 5 Units into the skin 4 (four) times daily -  before meals and at bedtime. For blood sugar over 150    Historical Provider, MD  ondansetron (ZOFRAN) 4 MG tablet Take 1 tablet (4 mg total) by mouth every 8 (eight) hours as needed for nausea or vomiting. 10/26/14   Lenell AntuJamie Julio Zappia, MD  senna (SENOKOT) 8.6 MG tablet Take 1 tablet by mouth daily.    Historical Provider, MD   BP 123/56 mmHg  Pulse 98  Temp(Src) 100.4 F (38 C) (Oral)  Resp 22  Ht 5\' 11"  (1.803 m)  Wt 165 lb (74.844 kg)  BMI 23.02 kg/m2  SpO2 100%   Physical Exam  Constitutional: He is oriented to person, place, and time. He appears well-developed and well-nourished. No distress.  HENT:  Head: Normocephalic and atraumatic.  Nose: Nose normal.  Mouth/Throat: Oropharynx is clear and moist. No oropharyngeal exudate.  Eyes: EOM are normal. Pupils are equal, round, and reactive to light.  Neck: Normal range of motion. Neck supple.  Cardiovascular: Normal rate, regular rhythm, normal heart sounds and intact distal pulses.   No murmur heard. Pulmonary/Chest: Effort normal and breath sounds normal. No respiratory distress. He has no wheezes. He exhibits no tenderness.  Abdominal:  Soft. He exhibits no distension. There is tenderness (mild diffuse tenderness). There is no guarding.  Musculoskeletal: Normal range of motion. He exhibits no tenderness.  Neurological: He is alert and oriented to person, place, and time. Coordination normal.  Alert and oriented.  Very slight dysarthria but is easily understandable.  Mnimal left cheek fold flattening compared to right.  Biceps and triceps are 4/5 on left and hip flexors are 4/5 on left, but remainder of exam is 5/5 throughout.   Skin: Skin is warm and dry. He is not diaphoretic. No pallor.  Psychiatric: He has a normal mood and affect. His behavior is normal. Judgment and thought content normal.  Nursing note and vitals reviewed.   ED Course  Procedures (including critical care time) Labs Review Labs Reviewed  URINALYSIS, ROUTINE W REFLEX MICROSCOPIC - Abnormal; Notable for the following:    APPearance CLOUDY (*)    Glucose, UA 500 (*)  Hgb urine dipstick TRACE (*)    Ketones, ur 15 (*)    Protein, ur 30 (*)    Leukocytes, UA MODERATE (*)    All other components within normal limits  COMPREHENSIVE METABOLIC PANEL - Abnormal; Notable for the following:    Glucose, Bld 192 (*)    Albumin 3.3 (*)    GFR calc non Af Amer 58 (*)    GFR calc Af Amer 67 (*)    All other components within normal limits  CBC WITH DIFFERENTIAL/PLATELET - Abnormal; Notable for the following:    RBC 3.42 (*)    Hemoglobin 10.8 (*)    HCT 31.8 (*)    Neutrophils Relative % 81 (*)    Neutro Abs 8.2 (*)    All other components within normal limits  URINE MICROSCOPIC-ADD ON - Abnormal; Notable for the following:    Bacteria, UA MANY (*)    All other components within normal limits  URINE RAPID DRUG SCREEN (HOSP PERFORMED)  PROTIME-INR  LIPASE, BLOOD  I-STAT TROPOININ, ED    Imaging Review Ct Head Wo Contrast  10/26/2014   CLINICAL DATA:  Left-sided weakness and left facial droop  EXAM: CT HEAD WITHOUT CONTRAST  TECHNIQUE: Contiguous  axial images were obtained from the base of the skull through the vertex without intravenous contrast.  COMPARISON:  None.  FINDINGS: The bony calvarium is intact. No gross soft tissue abnormality is noted. Mild atrophic changes are noted commensurate with the patient's given age. No findings to suggest acute hemorrhage, acute infarction or space-occupying mass lesion are noted. Old lacunar infarct is noted within the thalamus on the right.  IMPRESSION: Chronic atrophic and ischemic changes without acute abnormality.   Electronically Signed   By: Alcide Clever M.D.   On: 10/26/2014 21:52     EKG Interpretation   Date/Time:  Wednesday October 26 2014 20:45:02 EDT Ventricular Rate:  80 PR Interval:  137 QRS Duration: 102 QT Interval:  361 QTC Calculation: 416 R Axis:   56 Text Interpretation:  Sinus rhythm Probable left atrial enlargement LVH  with secondary repolarization abnormality Baseline wander in lead(s) V3 No  significant change was found Confirmed by [facility]  MD, TREY ([id number]) on  10/26/2014 10:11:03 PM      MDM   Final diagnoses:  Left-sided weakness  Facial droop  Nausea  UTI (lower urinary tract infection)   Pt is a 69 yo with hx of CVA, DM, HTN, CAD, who presents with worsening left sided weakness.  He has chronic left sided weakness from CVA but son is concerned about worsening left leg and new left facial droop since Monday morning when he saw the patient last.  Ambulates with a walker at baseline, still able to do that today. Pt complains of mild nausea today and some diffuse mild abdominal pain.  No change in BM habits.  Had a temp of 100.4 on arrival but reports he was previously unaware of a fever.  Still tolerating normal PO intake, no choking, no SOB, no chest pain.  Patient has very slight dysarthria but is easily understandable.  He has minimal left cheek fold flattening compared to right.  Biceps and triceps are 4/5 on left and hip flexors are 4/5 on left, but remainder  of exam is 5/5 throughout.  Abd soft and nontender.   Will work up with stroke labs and CT head due to concern for increasing weakness and facial droop. Abd exam is benign, so doubt acute emergent pathology.  He was given zofran for nausea.  Will follow labs and urine.   CT head with no acute changes. No sign of new stroke.    Work up returned benign except for a UTI.  He was given a Rx for keflex and for zofran for nausea.  Discussed with pt and his son about the prescriptions, close PCP follow up, and return precautions.  He was advised to take tylenol as needed for fevers.  Encouraged good PO fluid intake.   When getting patient ready for discharge, his telemetry was noted to have an abnormality by the RN.  A repeat EKG was ordered.   Repeat EKG at 1200 was acutely different than the previous at 2045.  Now with diffuse T wave inversions.   Will repeat.  Patient is still asymptomatic- denies chest pain, SOB, nausea. istat Trop sent.  Vitals unchanged from previous.  Has been in the 120-150s systolics throughout.   0020: istat Trop at 0.01.   Will get a delta due to the EKG changes. He still continues to deny chest pain and has no change in vital signs.   Paged cardiology to discuss with Dr. Tresa Endo.  He believes the repolarization abnormalities to be due to tachycardia but not likely to be ischemic.  As patient has been chest pain free and has negative biomarkers, no additional work up is necessary.  His tachycardia is likely stemming from his continued elevated temperature (100.4) so was given tylenol.   [facility] called to back to transport patient back to his [facility].  Discharged in stable condition.    Patient was seen with ED Attending, Dr. Rogers Blocker, MD   Lenell Antu, MD 10/27/14 1610  Blake Divine, MD 10/27/14 [phone]

## 2014-10-26 NOTE — ED Notes (Signed)
[facility] c/o "abnormal sensation on L side."  EMS reports staff unable to provide last seen normal but reports staff stated pt leaning to L side in wheelchair.   EMS reports staff reports baseline mental status.  Pt hx hemapalegic, uses wheelchair.  Pt warm to touch.  NAD noted at this time.

## 2014-10-26 NOTE — ED Notes (Signed)
Called lab to inquire about UA not resulting. In process.

## 2014-10-27 LAB — I-STAT TROPONIN, ED: TROPONIN I, POC: 0.01 ng/mL (ref 0.00–0.08)

## 2014-10-27 MED ORDER — ONDANSETRON 4 MG PO TBDP
4.0000 mg | ORAL_TABLET | Freq: Once | ORAL | Status: AC
  Administered 2014-10-27: 4 mg via ORAL
  Filled 2014-10-27: qty 1

## 2014-10-27 MED ORDER — ACETAMINOPHEN 325 MG PO TABS
650.0000 mg | ORAL_TABLET | Freq: Once | ORAL | Status: AC
  Administered 2014-10-27: 650 mg via ORAL
  Filled 2014-10-27: qty 2

## 2014-10-27 NOTE — ED Notes (Signed)
[facility] vomited while being [facility].  [facility], SOB, dizziness, lightheadedness.  Pt not diaphoretic.  Dr. Delford FieldWright made aware.

## 2014-10-27 NOTE — ED Notes (Signed)
This RN noted EKG changes at time of discharge.  New EKG printed and given to Dr Loretha StaplerWofford.

## 2014-11-08 ENCOUNTER — Encounter: Payer: Self-pay | Admitting: *Deleted

## 2014-11-16 ENCOUNTER — Encounter: Payer: Self-pay | Admitting: Adult Health

## 2014-11-16 ENCOUNTER — Non-Acute Institutional Stay (SKILLED_NURSING_FACILITY): Payer: Medicare Other | Admitting: Adult Health

## 2014-11-16 DIAGNOSIS — K5900 Constipation, unspecified: Secondary | ICD-10-CM | POA: Diagnosis not present

## 2014-11-16 DIAGNOSIS — E1140 Type 2 diabetes mellitus with diabetic neuropathy, unspecified: Secondary | ICD-10-CM | POA: Diagnosis not present

## 2014-11-16 DIAGNOSIS — K5909 Other constipation: Secondary | ICD-10-CM

## 2014-11-16 DIAGNOSIS — M170 Bilateral primary osteoarthritis of knee: Secondary | ICD-10-CM | POA: Diagnosis not present

## 2014-11-16 DIAGNOSIS — I639 Cerebral infarction, unspecified: Secondary | ICD-10-CM | POA: Diagnosis not present

## 2014-11-16 DIAGNOSIS — N400 Enlarged prostate without lower urinary tract symptoms: Secondary | ICD-10-CM | POA: Diagnosis not present

## 2014-11-16 DIAGNOSIS — K219 Gastro-esophageal reflux disease without esophagitis: Secondary | ICD-10-CM | POA: Diagnosis not present

## 2014-11-16 DIAGNOSIS — E785 Hyperlipidemia, unspecified: Secondary | ICD-10-CM

## 2014-11-16 DIAGNOSIS — I10 Essential (primary) hypertension: Secondary | ICD-10-CM | POA: Diagnosis not present

## 2014-11-16 DIAGNOSIS — E1149 Type 2 diabetes mellitus with other diabetic neurological complication: Secondary | ICD-10-CM

## 2014-11-16 MED ORDER — PANTOPRAZOLE SODIUM 40 MG PO TBEC: 40.0000 mg | DELAYED_RELEASE_TABLET | Freq: Two times a day (BID) | ORAL | Status: AC

## 2014-11-16 MED ORDER — AMLODIPINE BESYLATE 10 MG PO TABS: 5.0000 mg | ORAL_TABLET | Freq: Every day | ORAL | Status: DC

## 2014-11-16 NOTE — Progress Notes (Signed)
Patient ID: Roger Berger, male   DOB: 01/19/1946, 69 y.o.   MRN: [id number]  [facility]     No Known Allergies     Chief Complaint  Patient presents with  . Medical Management of Chronic Issues    HPI:  He is a long term resident of this facility being seen for the management of his chronic illnesses. Overall his status remains without change. He states he is feeling good. There are no nursing concerns being voiced today.    Past Medical History  Diagnosis Date  . Ataxia, late effect of cerebrovascular disease   . Aphasia, late effect of cerebrovascular disease   . Cerebral thrombosis   . Stroke   . Coronary artery disease   . Hypertension   . Diabetes mellitus   . Vitamin D deficiency 06/19/2014    Past Surgical History  Procedure Laterality Date  . Coronary artery bypass graft      VITAL SIGNS BP 110/57 mmHg  Pulse 69  Ht 5\' 11"  (1.803 m)  Wt 165 lb (74.844 kg)  BMI 23.02 kg/m2   Outpatient Encounter Prescriptions as of 10/19/2014  Medication Sig  . acetaminophen (TYLENOL) 325 MG tablet Take 650 mg by mouth every 6 (six) hours as needed for mild pain, fever or headache.  Marland Kitchen. amLODipine (NORVASC) 10 MG tablet Take 10 mg by mouth daily.  Marland Kitchen. aspirin 81 MG chewable tablet Chew 81 mg by mouth daily.  Marland Kitchen. atorvastatin (LIPITOR) 10 MG tablet Take 10 mg by mouth daily.  . cilostazol (PLETAL) 100 MG tablet Take 100 mg by mouth daily.   . clopidogrel (PLAVIX) 75 MG tablet Take 1 tablet by mouth Daily.  . diclofenac sodium (VOLTAREN) 1 % GEL Apply 2 g topically every 6 (six) hours as needed. For pain to shoulder, use 4 g for pain to knees every 6 hours prn.  . docusate sodium (COLACE) 100 MG capsule Take 100 mg by mouth 2 (two) times daily.  . furosemide (LASIX) 40 MG tablet Take 40 mg by mouth daily.  . hydrALAZINE (APRESOLINE) 25 MG tablet Take 25 mg by mouth daily.   . Insulin Detemir (LEVEMIR FLEXPEN) 100 UNIT/ML Pen Inject 10 Units into the skin daily at 6 PM. For  diabetes  . insulin lispro (HUMALOG) 100 UNIT/ML injection Inject 5 Units into the skin 4 (four) times daily -  before meals and at bedtime. For blood sugar over 150  . metFORMIN (GLUCOPHAGE) 500 MG tablet Take 1,250 mg by mouth 2 (two) times daily with a meal.   . metoprolol (LOPRESSOR) 100 MG tablet Take 100 mg by mouth daily.  Marland Kitchen. oxybutynin (DITROPAN-XL) 5 MG 24 hr tablet Take 5 mg by mouth daily.   . pantoprazole (PROTONIX) 40 MG tablet Take 40 mg by mouth daily.  . ramipril (ALTACE) 10 MG capsule TAKE ONE CAPSULE BY MOUTH TWICE DAILY  . senna (SENOKOT) 8.6 MG tablet Take 1 tablet by mouth daily.  . sitaGLIPtin (JANUVIA) 100 MG tablet Take 100 mg by mouth daily.  . tamsulosin (FLOMAX) 0.4 MG CAPS capsule Take 0.4 mg by mouth daily.       SIGNIFICANT DIAGNOSTIC EXAMS    LABS REVIEWED:   03-24-14: vitamin d 51.82  05-12-14: glucose 96; bun 22.6; creat 1.48; k+4.5; na++ 141; liver normal albumin 3.6; hgb a1c 6.0;  Chol 150; ldl 86; trig 61; urine micro-albumin 1.2 09-09-14: glucose 82; bun 13.2; creat 1.03; k+4.3; na++150; liver normal albumin 4.1; hgb a1c 5.9  ROS Constitutional: Negative for malaise/fatigue.  Respiratory: Negative for cough and shortness of breath.   Cardiovascular: Negative for chest pain, palpitations and leg swelling.  Gastrointestinal: Negative for heartburn, abdominal pain and constipation.  Musculoskeletal: Negative for myalgias, back pain and joint pain.  Skin: Negative.   Neurological: Negative for headaches.  Psychiatric/Behavioral: Negative for depression. The patient is not nervous/anxious.     Physical Exam Constitutional: He appears well-developed and well-nourished. No distress.  Neck: Neck supple. No JVD present. No thyromegaly present.  Cardiovascular: Normal rate, regular rhythm and intact distal pulses.   Respiratory: Effort normal and breath sounds normal. No respiratory distress.  GI: Soft. Bowel sounds are normal. He exhibits no  distension. There is no tenderness.  Musculoskeletal: He exhibits no edema.  Is able to move all extremities   Neurological: He is alert.  Skin: Skin is warm and dry. He is not diaphoretic.     ASSESSMENT/ PLAN:   1. CVA: he is neurologically stable will continue his status 81 mg daily; plavix 75 mg daily; will not make changes will monitor   2. Hypertension:  Will continue altace 10 mg twice daily; norvasc 10 mg daily will continue hydralazine 25 mg daily will continue lopressor 100 mg daily and will monitor his status.   3. Diabetes: his hgb a1c is 6.0. Will continue levemir 10 units daily; humalog ac/hs for cbg >=150; januvia 100 mg daily and metformin 1250 mg twice daily and will monitor  Is on ace; statin; asa. hgb a1c is 5.9   4. Edema: is stable will continue lasix 40 mg daily and will monitor   5. Dyslipidemia: will continue lipitor 10 mg daily his ldl is 86  6. Osteoarthritis: will continue voltaren gel 2 gm to shoulder and 4 gm to knees every 6 hours as needed and will monitor   7. GERD: will continue protonix 40 mg daily   8. BPH: will continue flomax daily and ditropan xl 5 mg daily for UI  9. PAD: he remains stable; no complaints of pain present; will continue pletal 100 mg daily and will monitor   10.. Constipation: will continue colace twice daily and senna  daily       Synthia Innocenteborah Silviano Neuser NP [facility]  Contact [phone] Monday through Friday 8am- 5pm  After hours call [phone]

## 2014-11-16 NOTE — Progress Notes (Signed)
Patient ID: Roger Berger L Wicke, male   DOB: 17-Sep-1945, 69 y.o.   MRN: [id number]  [facility]     No Known Allergies     Chief Complaint  Patient presents with  . Medical Management of Chronic Issues    HPI:  He is a long term resident of this facility being seen for the management of his chronic illnesses. He was treated in the ED over this past month for a presumed uti without further incidents. He has had increased indigestion over the recent past which is worse at night. There are no nursing concerns today.   Past Medical History  Diagnosis Date  . Ataxia, late effect of cerebrovascular disease   . Aphasia, late effect of cerebrovascular disease   . Cerebral thrombosis   . Stroke   . Coronary artery disease   . Hypertension   . Diabetes mellitus   . Vitamin D deficiency 06/19/2014    Past Surgical History  Procedure Laterality Date  . Coronary artery bypass graft      VITAL SIGNS BP 119/50 mmHg  Pulse 90  Ht 5\' 9"  (1.753 m)  Wt 157 lb (71.215 kg)  BMI 23.17 kg/m2   Outpatient Encounter Prescriptions as of 11/16/2014  Medication Sig  . acetaminophen (TYLENOL) 325 MG tablet Take 650 mg by mouth every 6 (six) hours as needed for mild pain, fever or headache.  Marland Kitchen. amLODipine (NORVASC) 10 MG tablet Take 10 mg by mouth daily.  Marland Kitchen. aspirin 81 MG chewable tablet Chew 81 mg by mouth daily.  Marland Kitchen. atorvastatin (LIPITOR) 10 MG tablet Take 10 mg by mouth daily.  . cilostazol (PLETAL) 100 MG tablet Take 100 mg by mouth daily.   . clopidogrel (PLAVIX) 75 MG tablet Take 1 tablet by mouth Daily.  Marland Kitchen. docusate sodium (COLACE) 100 MG capsule Take 100 mg by mouth 2 (two) times daily.  . furosemide (LASIX) 40 MG tablet Take 40 mg by mouth daily.  . hydrALAZINE (APRESOLINE) 25 MG tablet Take 25 mg by mouth daily.   . Insulin Detemir (LEVEMIR FLEXPEN) 100 UNIT/ML Pen Inject 10 Units into the skin daily at 6 PM. For diabetes  . metFORMIN (GLUCOPHAGE) 500 MG tablet Take 1,250 mg by mouth 2  (two) times daily with a meal.   . metoprolol (LOPRESSOR) 100 MG tablet Take 100 mg by mouth daily.  . Multiple Vitamins-Minerals (MULTIVITAMIN WITH MINERALS) tablet Take 1 tablet by mouth daily.  . ondansetron (ZOFRAN) 4 MG tablet Take 1 tablet (4 mg total) by mouth every 8 (eight) hours as needed for nausea or vomiting.  Marland Kitchen. oxybutynin (DITROPAN-XL) 5 MG 24 hr tablet Take 5 mg by mouth daily.   . pantoprazole (PROTONIX) 40 MG tablet Take 40 mg by mouth daily.  . ramipril (ALTACE) 10 MG capsule TAKE ONE CAPSULE BY MOUTH TWICE DAILY  . sennosides-docusate sodium (SENOKOT-S) 8.6-50 MG tablet Take 1 tablet by mouth daily as needed for constipation.  . sitaGLIPtin (JANUVIA) 100 MG tablet Take 100 mg by mouth daily.  . tamsulosin (FLOMAX) 0.4 MG CAPS capsule Take 0.4 mg by mouth daily.       SIGNIFICANT DIAGNOSTIC EXAMS  10-26-14: ct of head: Chronic atrophic and ischemic changes without acute abnormality.   LABS REVIEWED:   03-24-14: vitamin d 51.82  05-12-14: glucose 96; bun 22.6; creat 1.48; k+4.5; na++ 141; liver normal albumin 3.6; hgb a1c 6.0;  Chol 150; ldl 86; trig 61; urine micro-albumin 1.2 09-09-14: glucose 82; bun 13.2; creat 1.03; k+4.3; na++150; liver  normal albumin 4.1; hgb a1c 5.9 10-20-14: wbc 5.7; hgb 10.0; hct 31.8; mcv 95.5; plt 234; hgb a1c 6.1  10-26-14: wbc 10.0; hgb 10.8; hct 31.8; mcv 93  plt 222; glucose 192; bun 13; creat 1.22; k+3.7; na++137; liver normal albumin 3.3  10-27-14: stool for guaiac: neg     ROS Constitutional: Negative for malaise/fatigue.  Respiratory: Negative for cough and shortness of breath.   Cardiovascular: Negative for chest pain, palpitations and leg swelling.  Gastrointestinal: Negative for  abdominal pain and constipation. has heartburn  Musculoskeletal: Negative for myalgias, back pain and joint pain.  Skin: Negative.   Neurological: Negative for headaches.  Psychiatric/Behavioral: Negative for depression. The patient is not  nervous/anxious.     Physical Exam Constitutional: He appears well-developed and well-nourished. No distress.  Neck: Neck supple. No JVD present. No thyromegaly present.  Cardiovascular: Normal rate, regular rhythm and intact distal pulses.   Respiratory: Effort normal and breath sounds normal. No respiratory distress.  GI: Soft. Bowel sounds are normal. He exhibits no distension. There is no tenderness.  Musculoskeletal: He exhibits no edema.  Is able to move all extremities   Neurological: He is alert.  Skin: Skin is warm and dry. He is not diaphoretic.    ASSESSMENT/ PLAN:   1. CVA: he is neurologically stable will continue his status 81 mg daily; plavix 75 mg daily; will not make changes will monitor   2. Hypertension:  Will continue altace 10 mg twice daily;will continue hydralazine 25 mg daily will continue lopressor 100 mg daily and will lower norvasc to 5 mg daily will monitor his status.   3. Diabetes: . Will continue levemir 10 units daily;  januvia 100 mg daily and metformin 1250 mg twice daily and will monitor  Is on ace; statin; asa. hgb a1c is 6.1   4. Edema: is stable will continue lasix 40 mg daily and will monitor   5. Dyslipidemia: will continue lipitor 10 mg daily his ldl is 86  6. Osteoarthritis: is presently not on medications; will monitor   7. GERD: will increase protonix to 40 mg twice daily    8. BPH: will continue flomax 0.4 mg  daily and ditropan xl 5 mg daily for UI  9. PAD: he remains stable; no complaints of pain present; will continue pletal 100 mg daily and will monitor   10.. Constipation: will continue colace twice daily and senna  daily     Synthia Innocenteborah Green NP [facility]  Contact [phone] Monday through Friday 8am- 5pm  After hours call [phone]

## 2014-12-01 NOTE — Progress Notes (Signed)
Patient ID: Roger Berger, male   DOB: 1945-07-22, 69 y.o.   MRN: [id number]  [facility]     No Known Allergies     Chief Complaint  Patient presents with  . Medical Management of Chronic Issues    HPI:    Past Medical History  Diagnosis Date  . Ataxia, late effect of cerebrovascular disease   . Aphasia, late effect of cerebrovascular disease   . Cerebral thrombosis   . Stroke   . Coronary artery disease   . Hypertension   . Diabetes mellitus   . Vitamin D deficiency 06/19/2014    Past Surgical History  Procedure Laterality Date  . Coronary artery bypass graft      VITAL SIGNS BP 110/66 mmHg  Pulse 70  Ht 5\' 9"  (1.753 m)  Wt 157 lb (71.215 kg)  BMI 23.17 kg/m2  SpO2 98%   Outpatient Encounter Prescriptions as of 10/19/2014  Medication Sig  . acetaminophen (TYLENOL) 325 MG tablet Take 650 mg by mouth every 6 (six) hours as needed for mild pain, fever or headache.  Marland Kitchen. aspirin 81 MG chewable tablet Chew 81 mg by mouth daily.  Marland Kitchen. atorvastatin (LIPITOR) 10 MG tablet Take 10 mg by mouth daily.  . cilostazol (PLETAL) 100 MG tablet Take 100 mg by mouth daily.   . clopidogrel (PLAVIX) 75 MG tablet Take 1 tablet by mouth Daily.  Marland Kitchen. docusate sodium (COLACE) 100 MG capsule Take 100 mg by mouth 2 (two) times daily.  . furosemide (LASIX) 40 MG tablet Take 40 mg by mouth daily.  . hydrALAZINE (APRESOLINE) 25 MG tablet Take 25 mg by mouth daily.   . Insulin Detemir (LEVEMIR FLEXPEN) 100 UNIT/ML Pen Inject 10 Units into the skin daily at 6 PM. For diabetes  . metFORMIN (GLUCOPHAGE) 500 MG tablet Take 1,250 mg by mouth 2 (two) times daily with a meal.   . metoprolol (LOPRESSOR) 100 MG tablet Take 100 mg by mouth daily.  Marland Kitchen. oxybutynin (DITROPAN-XL) 5 MG 24 hr tablet Take 5 mg by mouth daily.   . ramipril (ALTACE) 10 MG capsule TAKE ONE CAPSULE BY MOUTH TWICE DAILY  . sitaGLIPtin (JANUVIA) 100 MG tablet Take 100 mg by mouth daily.  . tamsulosin (FLOMAX) 0.4 MG CAPS capsule  Take 0.4 mg by mouth daily.       SIGNIFICANT DIAGNOSTIC EXAMS    ROS   Physical Exam     ASSESSMENT/ PLAN:    Synthia InnocentDeborah Armonie Mettler NP [facility]  Contact [phone] Monday through Friday 8am- 5pm  After hours call [phone]   This encounter was created in error - please disregard.

## 2014-12-27 ENCOUNTER — Non-Acute Institutional Stay (SKILLED_NURSING_FACILITY): Payer: Medicare Other | Admitting: Adult Health

## 2014-12-27 ENCOUNTER — Encounter: Payer: Self-pay | Admitting: Adult Health

## 2014-12-27 DIAGNOSIS — I25810 Atherosclerosis of coronary artery bypass graft(s) without angina pectoris: Secondary | ICD-10-CM

## 2014-12-27 DIAGNOSIS — Z951 Presence of aortocoronary bypass graft: Secondary | ICD-10-CM | POA: Diagnosis not present

## 2014-12-27 DIAGNOSIS — E785 Hyperlipidemia, unspecified: Secondary | ICD-10-CM

## 2014-12-27 DIAGNOSIS — K5900 Constipation, unspecified: Secondary | ICD-10-CM | POA: Diagnosis not present

## 2014-12-27 DIAGNOSIS — E1165 Type 2 diabetes mellitus with hyperglycemia: Secondary | ICD-10-CM | POA: Diagnosis not present

## 2014-12-27 DIAGNOSIS — IMO0002 Reserved for concepts with insufficient information to code with codable children: Secondary | ICD-10-CM

## 2014-12-27 DIAGNOSIS — N400 Enlarged prostate without lower urinary tract symptoms: Secondary | ICD-10-CM | POA: Diagnosis not present

## 2014-12-27 DIAGNOSIS — K5909 Other constipation: Secondary | ICD-10-CM

## 2014-12-27 DIAGNOSIS — I739 Peripheral vascular disease, unspecified: Secondary | ICD-10-CM | POA: Diagnosis not present

## 2014-12-27 DIAGNOSIS — I10 Essential (primary) hypertension: Secondary | ICD-10-CM

## 2014-12-27 DIAGNOSIS — G8110 Spastic hemiplegia affecting unspecified side: Secondary | ICD-10-CM

## 2014-12-27 DIAGNOSIS — K219 Gastro-esophageal reflux disease without esophagitis: Secondary | ICD-10-CM

## 2014-12-27 DIAGNOSIS — E1139 Type 2 diabetes mellitus with other diabetic ophthalmic complication: Secondary | ICD-10-CM | POA: Diagnosis not present

## 2014-12-27 DIAGNOSIS — I639 Cerebral infarction, unspecified: Secondary | ICD-10-CM

## 2014-12-27 NOTE — Progress Notes (Signed)
Patient ID: Roger Berger, male   DOB: Jun 06, 1946, 69 y.o.   MRN: [id number]  [facility]     No Known Allergies     Chief Complaint  Patient presents with  . Annual Exam    HPI:  He is a long term resident of this facility being seen for his annual exam. He has remained stable since his admission to this facility; without hospitalizations.  He is not voicing any complaints or concerns states that he is feeling good. There are no nursing concerns at this time.     Past Medical History  Diagnosis Date  . Ataxia, late effect of cerebrovascular disease   . Aphasia, late effect of cerebrovascular disease   . Cerebral thrombosis   . Stroke   . Coronary artery disease   . Hypertension   . Diabetes mellitus   . Vitamin D deficiency 06/19/2014    Past Surgical History  Procedure Laterality Date  . Coronary artery bypass graft      Family History  Problem Relation Age of Onset  . Heart disease Father     History   Social History  . Marital Status: Married    Spouse Name: N/A  . Number of Children: N/A  . Years of Education: N/A   Occupational History  . Not on file.   Social History Main Topics  . Smoking status: Former Games developer  . Smokeless tobacco: Never Used     Comment: quit about 30 years ago.  . Alcohol Use: No  . Drug Use: Not on file  . Sexual Activity: Not on file   Other Topics Concern  . Not on file   Social History Narrative     VITAL SIGNS BP 140/73 mmHg  Pulse 64  Ht 5\' 9"  (1.753 m)  Wt 157 lb (71.215 kg)  BMI 23.17 kg/m2   Outpatient Encounter Prescriptions as of 12/27/2014  Medication Sig  . acetaminophen (TYLENOL) 325 MG tablet Take 650 mg by mouth every 6 (six) hours as needed for mild pain, fever or headache.  Marland Kitchen amLODipine (NORVASC) 10 MG tablet Take 0.5 tablets (5 mg total) by mouth daily.  Marland Kitchen aspirin 81 MG chewable tablet Chew 81 mg by mouth daily.  Marland Kitchen atorvastatin (LIPITOR) 10 MG tablet Take 10 mg by mouth daily.  .  cilostazol (PLETAL) 100 MG tablet Take 100 mg by mouth daily.   . clopidogrel (PLAVIX) 75 MG tablet Take 1 tablet by mouth Daily.  Marland Kitchen docusate sodium (COLACE) 100 MG capsule Take 100 mg by mouth 2 (two) times daily.  . furosemide (LASIX) 40 MG tablet Take 40 mg by mouth daily.  . hydrALAZINE (APRESOLINE) 25 MG tablet Take 25 mg by mouth daily.   . Insulin Detemir (LEVEMIR FLEXPEN) 100 UNIT/ML Pen Inject 10 Units into the skin daily at 6 PM. For diabetes  . metFORMIN (GLUCOPHAGE) 500 MG tablet Take 1,250 mg by mouth 2 (two) times daily with a meal.   . metoprolol (LOPRESSOR) 100 MG tablet Take 100 mg by mouth daily.  . Multiple Vitamins-Minerals (MULTIVITAMIN WITH MINERALS) tablet Take 1 tablet by mouth daily.  . ondansetron (ZOFRAN) 4 MG tablet Take 1 tablet (4 mg total) by mouth every 8 (eight) hours as needed for nausea or vomiting.  Marland Kitchen oxybutynin (DITROPAN-XL) 5 MG 24 hr tablet Take 5 mg by mouth daily.   . pantoprazole (PROTONIX) 40 MG tablet Take 1 tablet (40 mg total) by mouth 2 (two) times daily.  . ramipril (ALTACE) 10 MG  capsule TAKE ONE CAPSULE BY MOUTH TWICE DAILY  . sennosides-docusate sodium (SENOKOT-S) 8.6-50 MG tablet Take 1 tablet by mouth daily as needed for constipation.  . sitaGLIPtin (JANUVIA) 100 MG tablet Take 100 mg by mouth daily.  . tamsulosin (FLOMAX) 0.4 MG CAPS capsule Take 0.4 mg by mouth daily.     SIGNIFICANT DIAGNOSTIC EXAMS  10-26-14: ct of head: Chronic atrophic and ischemic changes without acute abnormality.   LABS REVIEWED:   03-24-14: vitamin d 51.82  05-12-14: glucose 96; bun 22.6; creat 1.48; k+4.5; na++ 141; liver normal albumin 3.6; hgb a1c 6.0;  Chol 150; ldl 86; trig 61; urine micro-albumin 1.2 09-09-14: glucose 82; bun 13.2; creat 1.03; k+4.3; na++150; liver normal albumin 4.1; hgb a1c 5.9 10-20-14: wbc 5.7; hgb 10.0; hct 31.8; mcv 95.5; plt 234; hgb a1c 6.1  10-26-14: wbc 10.0; hgb 10.8; hct 31.8; mcv 93  plt 222; glucose 192; bun 13; creat 1.22;  k+3.7; na++137; liver normal albumin 3.3  10-27-14: stool for guaiac: neg     ROS Constitutional: Negative for malaise/fatigue.  Respiratory: Negative for cough and shortness of breath.   Cardiovascular: Negative for chest pain, palpitations and leg swelling.  Gastrointestinal: Negative for  abdominal pain and constipation. no heart burn  Musculoskeletal: Negative for myalgias, back pain and joint pain.  Skin: Negative.   Neurological: Negative for headaches.  Psychiatric/Behavioral: Negative for depression. The patient is not nervous/anxious.      Physical Exam Constitutional: He appears well-developed and well-nourished. No distress.  Neck: Neck supple. No JVD present. No thyromegaly present.  Cardiovascular: Normal rate, regular rhythm and intact distal pulses.   Respiratory: Effort normal and breath sounds normal. No respiratory distress.  GI: Soft. Bowel sounds are normal. He exhibits no distension. There is no tenderness.  Musculoskeletal: He exhibits no edema.  Is able to move all extremities   Neurological: He is alert.  Skin: Skin is warm and dry. He is not diaphoretic.       ASSESSMENT/ PLAN:  1. CVA: he is neurologically stable will continue his asa 81 mg daily; plavix 75 mg daily; will not make changes will monitor   2. Hypertension:  Will continue altace 10 mg twice daily;will continue hydralazine 25 mg daily will continue lopressor 100 mg daily norvasc  5 mg daily will monitor his status.   3. Diabetes: . Will continue levemir 10 units daily;  januvia 100 mg daily and metformin 1250 mg twice daily and will monitor  Is on ace; statin; asa. hgb a1c is 6.1   4. Edema: is stable will continue lasix 40 mg daily and will monitor   5. Dyslipidemia: will continue lipitor 10 mg daily his ldl is 86  6. Osteoarthritis: is presently not on medications; will monitor   7. GERD: will increase protonix to 40 mg twice daily    8. BPH: will continue flomax 0.4 mg  daily  and ditropan xl 5 mg daily for UI  9. PAD: he remains stable; no complaints of pain present; will continue pletal 100 mg daily and will monitor   10.. Constipation: will continue colace twice daily and senna  daily   11. CAD: is status post CABG X5; no complaints of chest pain; will continue asa 81 mg daily; plavix 75 mg daily and will monitor    Will check lipids and PSA His health maintenance is up to date.     Synthia Innocent NP [facility]  Contact [phone] Monday through Friday 8am- 5pm  After hours call [phone]

## 2014-12-28 ENCOUNTER — Non-Acute Institutional Stay (SKILLED_NURSING_FACILITY): Payer: Medicare Other | Admitting: Adult Health

## 2014-12-28 ENCOUNTER — Encounter: Payer: Self-pay | Admitting: Adult Health

## 2014-12-28 DIAGNOSIS — E785 Hyperlipidemia, unspecified: Secondary | ICD-10-CM | POA: Diagnosis not present

## 2014-12-28 DIAGNOSIS — R972 Elevated prostate specific antigen [PSA]: Secondary | ICD-10-CM

## 2014-12-28 DIAGNOSIS — N400 Enlarged prostate without lower urinary tract symptoms: Secondary | ICD-10-CM

## 2014-12-28 NOTE — Progress Notes (Signed)
Patient ID: Roger Berger, male   DOB: [birth year], 69 y.o.   MRN: [id number]  [facility]     No Known Allergies     Chief Complaint  Patient presents with  . Acute Visit    follow up labs    HPI:  His psa is elevated 9.97. He tells me that he has an enlarged prostate; but denies any history of cancer. He odes have difficulty with his urinary stream. We did discuss the results of his psa and will have urology evaluate him further.    Past Medical History  Diagnosis Date  . Ataxia, late effect of cerebrovascular disease   . Aphasia, late effect of cerebrovascular disease   . Cerebral thrombosis   . Stroke   . Coronary artery disease   . Hypertension   . Diabetes mellitus   . Vitamin D deficiency 06/19/2014  . 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 OA (osteoarthritis) of knee 12/31/2012  . Foot drop 12/31/2012  . BPH (benign prostatic hyperplasia) 06/19/2014    Past Surgical History  Procedure Laterality Date  . Coronary artery bypass graft      VITAL SIGNS BP 132/72 mmHg  Pulse 70  Ht 5\' 9"  (1.753 m)  Wt 152 lb (68.947 kg)  BMI 22.44 kg/m2  SpO2 98%   Outpatient Encounter Prescriptions as of 12/28/2014  Medication Sig  . acetaminophen (TYLENOL) 325 MG tablet Take 650 mg by mouth every 6 (six) hours as needed for mild pain, fever or headache.  Marland Kitchen amLODipine (NORVASC) 10 MG tablet Take 0.5 tablets (5 mg total) by mouth daily.  Marland Kitchen aspirin 81 MG chewable tablet Chew 81 mg by mouth daily.  Marland Kitchen atorvastatin (LIPITOR) 10 MG tablet Take 10 mg by mouth daily.  . cilostazol (PLETAL) 100 MG tablet Take 100 mg by mouth daily.   . clopidogrel (PLAVIX) 75 MG tablet Take 1 tablet by mouth Daily.  Marland Kitchen docusate sodium (COLACE) 100 MG capsule Take 100 mg by mouth 2 (two) times daily.  . furosemide (LASIX) 40 MG tablet Take 40 mg by mouth daily.  . hydrALAZINE (APRESOLINE) 25 MG tablet Take 25 mg by mouth daily.   . Insulin Detemir (LEVEMIR FLEXPEN)  100 UNIT/ML Pen Inject 10 Units into the skin daily at 6 PM. For diabetes  . metFORMIN (GLUCOPHAGE) 500 MG tablet Take 1,250 mg by mouth 2 (two) times daily with a meal.   . metoprolol (LOPRESSOR) 100 MG tablet Take 100 mg by mouth daily.  . Multiple Vitamins-Minerals (MULTIVITAMIN WITH MINERALS) tablet Take 1 tablet by mouth daily.  . ondansetron (ZOFRAN) 4 MG tablet Take 1 tablet (4 mg total) by mouth every 8 (eight) hours as needed for nausea or vomiting.  Marland Kitchen oxybutynin (DITROPAN-XL) 5 MG 24 hr tablet Take 5 mg by mouth daily.   . pantoprazole (PROTONIX) 40 MG tablet Take 1 tablet (40 mg total) by mouth 2 (two) times daily.  . ramipril (ALTACE) 10 MG capsule TAKE ONE CAPSULE BY MOUTH TWICE DAILY  . sennosides-docusate sodium (SENOKOT-S) 8.6-50 MG tablet Take 1 tablet by mouth daily as needed for constipation.  . sitaGLIPtin (JANUVIA) 100 MG tablet Take 100 mg by mouth daily.  . tamsulosin (FLOMAX) 0.4 MG CAPS capsule Take 0.4 mg by mouth daily.       SIGNIFICANT DIAGNOSTIC EXAMS   10-26-14: ct of head: Chronic atrophic and ischemic changes without acute abnormality.   LABS REVIEWED:   03-24-14: vitamin d 51.82  05-12-14: glucose 96; bun 22.6; creat 1.48; k+4.5; na++ 141; liver normal albumin 3.6; hgb a1c 6.0;  Chol 150; ldl 86; trig 61; urine micro-albumin 1.2 09-09-14: glucose 82; bun 13.2; creat 1.03; k+4.3; na++150; liver normal albumin 4.1; hgb a1c 5.9 10-20-14: wbc 5.7; hgb 10.0; hct 31.8; mcv 95.5; plt 234; hgb a1c 6.1  10-26-14: wbc 10.0; hgb 10.8; hct 31.8; mcv 93  plt 222; glucose 192; bun 13; creat 1.22; k+3.7; na++137; liver normal albumin 3.3  10-27-14: stool for guaiac: neg 12-28-14: chol 143; ldl 85; trig 110; hdl 36; PSA 9.97      ROS Constitutional: Negative for malaise/fatigue.  Respiratory: Negative for cough and shortness of breath.   Cardiovascular: Negative for chest pain, palpitations and leg swelling.  Gastrointestinal: Negative for  abdominal pain and  constipation. no heart burn  Musculoskeletal: Negative for myalgias, back pain and joint pain.  Skin: Negative.   Neurological: Negative for headaches.  Psychiatric/Behavioral: Negative for depression. The patient is not nervous/anxious.      Physical Exam Constitutional: He appears well-developed and well-nourished. No distress.  Neck: Neck supple. No JVD present. No thyromegaly present.  Cardiovascular: Normal rate, regular rhythm and intact distal pulses.   Respiratory: Effort normal and breath sounds normal. No respiratory distress.  GI: Soft. Bowel sounds are normal. He exhibits no distension. There is no tenderness.  Musculoskeletal: He exhibits no edema.  Is able to move all extremities   Neurological: He is alert.  Skin: Skin is warm and dry. He is not diaphoretic.      ASSESSMENT/ PLAN:  1. Dyslipidemia: will continue lipitor 10 mg daily his ldl is 85  2. BPH: will continue flomax 0.4 mg  daily and ditropan xl 5 mg daily for UI; his psa is 9.97; will setup a urology consult for further evaluation and will monitor his status       Synthia Innocent NP [facility]  Contact [phone] Monday through Friday 8am- 5pm  After hours call [phone]

## 2015-01-18 ENCOUNTER — Encounter: Payer: Self-pay | Admitting: *Deleted

## 2015-01-23 ENCOUNTER — Non-Acute Institutional Stay (SKILLED_NURSING_FACILITY): Payer: Medicare Other | Admitting: Internal Medicine

## 2015-01-23 ENCOUNTER — Encounter: Payer: Self-pay | Admitting: Internal Medicine

## 2015-01-23 DIAGNOSIS — I10 Essential (primary) hypertension: Secondary | ICD-10-CM

## 2015-01-23 DIAGNOSIS — Z8673 Personal history of transient ischemic attack (TIA), and cerebral infarction without residual deficits: Secondary | ICD-10-CM

## 2015-01-23 DIAGNOSIS — E1140 Type 2 diabetes mellitus with diabetic neuropathy, unspecified: Secondary | ICD-10-CM

## 2015-01-23 DIAGNOSIS — G8110 Spastic hemiplegia affecting unspecified side: Secondary | ICD-10-CM

## 2015-01-23 DIAGNOSIS — E1149 Type 2 diabetes mellitus with other diabetic neurological complication: Secondary | ICD-10-CM

## 2015-01-23 DIAGNOSIS — K219 Gastro-esophageal reflux disease without esophagitis: Secondary | ICD-10-CM

## 2015-01-23 DIAGNOSIS — I739 Peripheral vascular disease, unspecified: Secondary | ICD-10-CM

## 2015-01-23 DIAGNOSIS — E785 Hyperlipidemia, unspecified: Secondary | ICD-10-CM

## 2015-01-23 DIAGNOSIS — N400 Enlarged prostate without lower urinary tract symptoms: Secondary | ICD-10-CM

## 2015-01-23 DIAGNOSIS — I25810 Atherosclerosis of coronary artery bypass graft(s) without angina pectoris: Secondary | ICD-10-CM | POA: Diagnosis not present

## 2015-01-23 NOTE — Progress Notes (Signed)
Patient ID: Roger Berger, male   DOB: [birth year], 69 y.o.   MRN: [id number]    DATE: 01/23/15  Location:  [facility]    Place of Service: SNF (31)   Extended Emergency Contact Information Primary Emergency Contact: Pettey,Antonio  [place] Phone: [phone] Relation: Son  Advanced Directive information  FULL CODE - MOST FORM ON CHART  Chief Complaint  Patient presents with  . Medical Management of Chronic Issues    HPI:  69 yo male long term resident seen today for f/u. He has intermittent GI sx's with bloating and belching. No other concerns. He takes PPI daily. No nursing issues. No recent falls. He keeps his legs elevated when prolonged sitting.  Hx CVA with residual deficits -  Stable on ASA/plavix   Hypertension - BP stable on altace, hydralazine, lopressor and norvasc   DM - takes levemir, januvia and metformin also takes ACEI statin, asa. Last  hgb a1c 6.1%   Edema - stable on lasix   Dyslipidemia - stable on lipitor. LDL 86  Osteoarthritis - no c/o of joint pain. Does not take anti-inflammatory  BPH - takes flomax and ditropan. PSA elevated and plans to see urology  PAD -  Stable on pletal and ASA/plavix   Constipation - takes colace and senna  CAD - s/p CABG X5. Takes ASA, plavix. No symptoms at this time.    Past Medical History  Diagnosis Date  . Ataxia, late effect of cerebrovascular disease   . Aphasia, late effect of cerebrovascular disease   . Cerebral thrombosis   . Stroke   . Coronary artery disease   . Hypertension   . Diabetes mellitus   . Vitamin D deficiency 06/19/2014  . 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 OA (osteoarthritis) of knee 12/31/2012  . Foot drop 12/31/2012  . BPH (benign prostatic hyperplasia) 06/19/2014    Past Surgical History  Procedure Laterality Date  . Coronary artery bypass graft      Patient Care Team: Gildardo Cranker, DO as PCP -  General (Internal Medicine) Gerlene Fee, NP as Nurse Practitioner (Nurse Practitioner)  History   Social History  . Marital Status: Married    Spouse Name: N/A  . Number of Children: N/A  . Years of Education: N/A   Occupational History  . Not on file.   Social History Main Topics  . Smoking status: Former Research scientist (life sciences)  . Smokeless tobacco: Never Used     Comment: quit about 30 years ago.  . Alcohol Use: No  . Drug Use: Not on file  . Sexual Activity: Not on file   Other Topics Concern  . Not on file   Social History Narrative     reports that he has quit smoking. He has never used smokeless tobacco. He reports that he does not drink alcohol. His drug history is not on file.  Immunization History  Administered Date(s) Administered  . Influenza-Unspecified 04/18/2014    No Known Allergies  Medications: Patient's Medications  New Prescriptions   No medications on file  Previous Medications   ACETAMINOPHEN (TYLENOL) 325 MG TABLET    Take 650 mg by mouth every 6 (six) hours as needed for mild pain, fever or headache.   AMLODIPINE (NORVASC) 10 MG TABLET    Take 0.5 tablets (5 mg total) by mouth daily.   ASPIRIN 81 MG CHEWABLE TABLET    Chew 81 mg by mouth daily.  ATORVASTATIN (LIPITOR) 10 MG TABLET    Take 10 mg by mouth daily.   CILOSTAZOL (PLETAL) 100 MG TABLET    Take 100 mg by mouth daily.    CLOPIDOGREL (PLAVIX) 75 MG TABLET    Take 1 tablet by mouth Daily.   DOCUSATE SODIUM (COLACE) 100 MG CAPSULE    Take 100 mg by mouth 2 (two) times daily.   FUROSEMIDE (LASIX) 40 MG TABLET    Take 40 mg by mouth daily.   HYDRALAZINE (APRESOLINE) 25 MG TABLET    Take 25 mg by mouth daily.    INSULIN DETEMIR (LEVEMIR FLEXPEN) 100 UNIT/ML PEN    Inject 10 Units into the skin daily at 6 PM. For diabetes   METFORMIN (GLUCOPHAGE) 500 MG TABLET    Take 1,250 mg by mouth 2 (two) times daily with a meal.    METOPROLOL (LOPRESSOR) 100 MG TABLET    Take 100 mg by mouth daily.   MULTIPLE  VITAMINS-MINERALS (MULTIVITAMIN WITH MINERALS) TABLET    Take 1 tablet by mouth daily.   ONDANSETRON (ZOFRAN) 4 MG TABLET    Take 1 tablet (4 mg total) by mouth every 8 (eight) hours as needed for nausea or vomiting.   OXYBUTYNIN (DITROPAN-XL) 5 MG 24 HR TABLET    Take 5 mg by mouth daily.    PANTOPRAZOLE (PROTONIX) 40 MG TABLET    Take 1 tablet (40 mg total) by mouth 2 (two) times daily.   RAMIPRIL (ALTACE) 10 MG CAPSULE    TAKE ONE CAPSULE BY MOUTH TWICE DAILY   SENNOSIDES-DOCUSATE SODIUM (SENOKOT-S) 8.6-50 MG TABLET    Take 1 tablet by mouth daily as needed for constipation.   SITAGLIPTIN (JANUVIA) 100 MG TABLET    Take 100 mg by mouth daily.   TAMSULOSIN (FLOMAX) 0.4 MG CAPS CAPSULE    Take 0.4 mg by mouth daily.   Modified Medications   No medications on file  Discontinued Medications   No medications on file    Review of Systems  Constitutional: Negative for chills, activity change and fatigue.  HENT: Negative for sore throat and trouble swallowing.   Eyes: Negative for visual disturbance.  Respiratory: Negative for cough, chest tightness and shortness of breath.   Cardiovascular: Negative for chest pain, palpitations and leg swelling.  Gastrointestinal: Positive for constipation and abdominal distention. Negative for nausea, vomiting, abdominal pain and blood in stool.  Genitourinary: Negative for urgency, frequency and difficulty urinating.  Musculoskeletal: Negative for arthralgias and gait problem.  Skin: Negative for rash.  Neurological: Negative for weakness and headaches.  Psychiatric/Behavioral: Negative for confusion and sleep disturbance. The patient is not nervous/anxious.     Filed Vitals:   01/23/15 1822  BP: 141/52  Pulse: 66  Temp: 97.9 F (36.6 C)  Weight: 149 lb (67.586 kg)  SpO2: 96%   Body mass index is 21.99 kg/(m^2).  Physical Exam  Constitutional: He is oriented to person, place, and time. He appears well-developed and well-nourished.  Sitting in  w/c in NAD.  HENT:  Mouth/Throat: Oropharynx is clear and moist.  Eyes: Pupils are equal, round, and reactive to light. No scleral icterus.  Neck: Neck supple. Carotid bruit is not present. No thyromegaly present.  Cardiovascular: Normal rate, regular rhythm, normal heart sounds and intact distal pulses.  Exam reveals no gallop and no friction rub.   No murmur heard. +1 pitting LE edema b/l. No calf TTP  Pulmonary/Chest: Effort normal and breath sounds normal. He has no wheezes. He has  no rales. He exhibits no tenderness.  Abdominal: Soft. Bowel sounds are normal. He exhibits no distension, no abdominal bruit, no pulsatile midline mass and no mass. There is no tenderness. There is no rebound and no guarding.  Musculoskeletal: He exhibits edema.  Lymphadenopathy:    He has no cervical adenopathy.  Neurological: He is alert and oriented to person, place, and time.  Skin: Skin is warm and dry. No rash noted.  Psychiatric: He has a normal mood and affect. His behavior is normal. Judgment and thought content normal.     Labs reviewed: Abstract on 11/08/2014  Component Date Value Ref Range Status  . Hemoglobin 10/20/2014 10.8* 13.5 - 17.5 g/dL Final  . WBC 10/20/2014 5.7   Final  . Glucose 09/09/2014 82   Final  . BUN 09/09/2014 13  4 - 21 mg/dL Final  . Creatinine 09/09/2014 1.0  .6 - 1.3 mg/dL Final  . Sodium 09/09/2014 150* 137 - 147 mmol/L Final  . Triglycerides 09/09/2014 79  40 - 160 mg/dL Final  . Cholesterol 09/09/2014 123  0 - 200 mg/dL Final  . HDL 09/09/2014 36  35 - 70 mg/dL Final  . LDL Cholesterol 09/09/2014 72   Final  . ALT 09/09/2014 7* 10 - 40 U/L Final  . Hgb A1c MFr Bld 09/09/2014 5.9  4.0 - 6.0 % Final  Admission on 10/26/2014, Discharged on 10/27/2014  Component Date Value Ref Range Status  . Troponin i, poc 10/26/2014 0.01  0.00 - 0.08 ng/mL Final  . Comment 3 10/26/2014          Final   Comment: Due to the release kinetics of cTnI, a negative result within the  first hours of the onset of symptoms does not rule out myocardial infarction with certainty. If myocardial infarction is still suspected, repeat the test at appropriate intervals.   . Opiates 10/26/2014 NONE DETECTED  NONE DETECTED Final  . Cocaine 10/26/2014 NONE DETECTED  NONE DETECTED Final  . Benzodiazepines 10/26/2014 NONE DETECTED  NONE DETECTED Final  . Amphetamines 10/26/2014 NONE DETECTED  NONE DETECTED Final  . Tetrahydrocannabinol 10/26/2014 NONE DETECTED  NONE DETECTED Final  . Barbiturates 10/26/2014 NONE DETECTED  NONE DETECTED Final   Comment:        DRUG SCREEN FOR MEDICAL PURPOSES ONLY.  IF CONFIRMATION IS NEEDED FOR ANY PURPOSE, NOTIFY LAB WITHIN 5 DAYS.        LOWEST DETECTABLE LIMITS FOR URINE DRUG SCREEN Drug Class       Cutoff (ng/mL) Amphetamine      1000 Barbiturate      200 Benzodiazepine   845 Tricyclics       364 Opiates          300 Cocaine          300 THC              50   . Color, Urine 10/26/2014 YELLOW  YELLOW Final  . APPearance 10/26/2014 CLOUDY* CLEAR Final  . Specific Gravity, Urine 10/26/2014 1.016  1.005 - 1.030 Final  . pH 10/26/2014 5.0  5.0 - 8.0 Final  . Glucose, UA 10/26/2014 500* NEGATIVE mg/dL Final  . Hgb urine dipstick 10/26/2014 TRACE* NEGATIVE Final  . Bilirubin Urine 10/26/2014 NEGATIVE  NEGATIVE Final  . Ketones, ur 10/26/2014 15* NEGATIVE mg/dL Final  . Protein, ur 10/26/2014 30* NEGATIVE mg/dL Final  . Urobilinogen, UA 10/26/2014 0.2  0.0 - 1.0 mg/dL Final  . Nitrite 10/26/2014 NEGATIVE  NEGATIVE Final  .  Leukocytes, UA 10/26/2014 MODERATE* NEGATIVE Final  . Sodium 10/26/2014 137  135 - 145 mmol/L Final  . Potassium 10/26/2014 3.7  3.5 - 5.1 mmol/L Final  . Chloride 10/26/2014 100  96 - 112 mmol/L Final  . CO2 10/26/2014 26  19 - 32 mmol/L Final  . Glucose, Bld 10/26/2014 192* 70 - 99 mg/dL Final  . BUN 10/26/2014 13  6 - 23 mg/dL Final  . Creatinine, Ser 10/26/2014 1.22  0.50 - 1.35 mg/dL Final  . Calcium  10/26/2014 9.1  8.4 - 10.5 mg/dL Final  . Total Protein 10/26/2014 6.9  6.0 - 8.3 g/dL Final  . Albumin 10/26/2014 3.3* 3.5 - 5.2 g/dL Final  . AST 10/26/2014 15  0 - 37 U/L Final  . ALT 10/26/2014 11  0 - 53 U/L Final  . Alkaline Phosphatase 10/26/2014 68  39 - 117 U/L Final  . Total Bilirubin 10/26/2014 0.6  0.3 - 1.2 mg/dL Final  . GFR calc non Af Amer 10/26/2014 58* >90 mL/min Final  . GFR calc Af Amer 10/26/2014 67* >90 mL/min Final   Comment: (NOTE) The eGFR has been calculated using the CKD EPI equation. This calculation has not been validated in all clinical situations. eGFR's persistently <90 mL/min signify possible Chronic Kidney Disease.   . Anion gap 10/26/2014 11  5 - 15 Final  . Prothrombin Time 10/26/2014 14.5  11.6 - 15.2 seconds Final  . INR 10/26/2014 1.12  0.00 - 1.49 Final  . Lipase 10/26/2014 23  11 - 59 U/L Final  . WBC 10/26/2014 10.0  4.0 - 10.5 K/uL Final  . RBC 10/26/2014 3.42* 4.22 - 5.81 MIL/uL Final  . Hemoglobin 10/26/2014 10.8* 13.0 - 17.0 g/dL Final  . HCT 10/26/2014 31.8* 39.0 - 52.0 % Final  . MCV 10/26/2014 93.0  78.0 - 100.0 fL Final  . MCH 10/26/2014 31.6  26.0 - 34.0 pg Final  . MCHC 10/26/2014 34.0  30.0 - 36.0 g/dL Final  . RDW 10/26/2014 13.8  11.5 - 15.5 % Final  . Platelets 10/26/2014 222  150 - 400 K/uL Final  . Neutrophils Relative % 10/26/2014 81* 43 - 77 % Final  . Neutro Abs 10/26/2014 8.2* 1.7 - 7.7 K/uL Final  . Lymphocytes Relative 10/26/2014 12  12 - 46 % Final  . Lymphs Abs 10/26/2014 1.2  0.7 - 4.0 K/uL Final  . Monocytes Relative 10/26/2014 7  3 - 12 % Final  . Monocytes Absolute 10/26/2014 0.7  0.1 - 1.0 K/uL Final  . Eosinophils Relative 10/26/2014 0  0 - 5 % Final  . Eosinophils Absolute 10/26/2014 0.0  0.0 - 0.7 K/uL Final  . Basophils Relative 10/26/2014 0  0 - 1 % Final  . Basophils Absolute 10/26/2014 0.0  0.0 - 0.1 K/uL Final  . WBC, UA 10/26/2014 TOO NUMEROUS TO COUNT  <3 WBC/hpf Final  . RBC / HPF 10/26/2014 3-6   <3 RBC/hpf Final  . Bacteria, UA 10/26/2014 MANY* RARE Final  . Edwina Barth 10/26/2014 RARE YEAST   Final  . Troponin i, poc 10/27/2014 0.01  0.00 - 0.08 ng/mL Final  . Comment 3 10/27/2014          Final   Comment: Due to the release kinetics of cTnI, a negative result within the first hours of the onset of symptoms does not rule out myocardial infarction with certainty. If myocardial infarction is still suspected, repeat the test at appropriate intervals.     No results found.  Assessment/Plan    ICD-9-CM ICD-10-CM   1. Gastroesophageal reflux disease without esophagitis - stable 530.81 K21.9   2. Type II diabetes mellitus with neurological manifestations - stable 250.60 E11.40   3. Essential hypertension - stable 401.9 I10   4. BPH (benign prostatic hyperplasia) - with elevated PSA 600.00 N40.0   5. 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 History of stroke - stable V12.54 Z86.73   7. PAD (peripheral artery disease) - stable 443.9 I73.9   8. Hyperlipidemia - stable  272.4 E78.5   9. Coronary artery disease involving coronary bypass graft of native heart without angina pectoris - s/p CABG 414.05 I25.810    --cont current meds as ordered  --urology appt pending  --cont daily CBGs  --PT/OT/ST as indicated  --Will follow  Lavona Norsworthy S. Perlie Gold  [facility] [address] [place], [place] [address] [phone] Cell (Monday-Friday 8 AM - 5 PM) [phone] After 5 PM and follow prompts

## 2015-02-02 ENCOUNTER — Encounter: Payer: Self-pay | Admitting: Internal Medicine

## 2015-02-28 LAB — BASIC METABOLIC PANEL
BUN: 32 mg/dL — AB (ref 4–21)
Creatinine: 1.3 mg/dL (ref <=1.3)
GLUCOSE: 143 mg/dL
Potassium: 4.5 mmol/L (ref 3.4–5.3)

## 2015-03-01 ENCOUNTER — Encounter (HOSPITAL_COMMUNITY): Payer: Self-pay | Admitting: Emergency Medicine

## 2015-03-01 ENCOUNTER — Emergency Department (HOSPITAL_COMMUNITY): Payer: Medicare Other

## 2015-03-01 DIAGNOSIS — I69920 Aphasia following unspecified cerebrovascular disease: Secondary | ICD-10-CM

## 2015-03-01 DIAGNOSIS — R001 Bradycardia, unspecified: Secondary | ICD-10-CM | POA: Diagnosis not present

## 2015-03-01 DIAGNOSIS — Z87891 Personal history of nicotine dependence: Secondary | ICD-10-CM

## 2015-03-01 DIAGNOSIS — A419 Sepsis, unspecified organism: Secondary | ICD-10-CM

## 2015-03-01 DIAGNOSIS — Z951 Presence of aortocoronary bypass graft: Secondary | ICD-10-CM

## 2015-03-01 DIAGNOSIS — I5021 Acute systolic (congestive) heart failure: Secondary | ICD-10-CM | POA: Diagnosis present

## 2015-03-01 DIAGNOSIS — Z7902 Long term (current) use of antithrombotics/antiplatelets: Secondary | ICD-10-CM

## 2015-03-01 DIAGNOSIS — N400 Enlarged prostate without lower urinary tract symptoms: Secondary | ICD-10-CM | POA: Diagnosis present

## 2015-03-01 DIAGNOSIS — R000 Tachycardia, unspecified: Secondary | ICD-10-CM | POA: Diagnosis present

## 2015-03-01 DIAGNOSIS — N179 Acute kidney failure, unspecified: Secondary | ICD-10-CM | POA: Diagnosis present

## 2015-03-01 DIAGNOSIS — J449 Chronic obstructive pulmonary disease, unspecified: Secondary | ICD-10-CM | POA: Diagnosis present

## 2015-03-01 DIAGNOSIS — N390 Urinary tract infection, site not specified: Secondary | ICD-10-CM | POA: Diagnosis present

## 2015-03-01 DIAGNOSIS — M179 Osteoarthritis of knee, unspecified: Secondary | ICD-10-CM | POA: Diagnosis present

## 2015-03-01 DIAGNOSIS — Z79899 Other long term (current) drug therapy: Secondary | ICD-10-CM | POA: Diagnosis not present

## 2015-03-01 DIAGNOSIS — E875 Hyperkalemia: Secondary | ICD-10-CM | POA: Diagnosis present

## 2015-03-01 DIAGNOSIS — G4089 Other seizures: Secondary | ICD-10-CM | POA: Diagnosis present

## 2015-03-01 DIAGNOSIS — N3000 Acute cystitis without hematuria: Secondary | ICD-10-CM | POA: Diagnosis present

## 2015-03-01 DIAGNOSIS — R112 Nausea with vomiting, unspecified: Secondary | ICD-10-CM | POA: Diagnosis present

## 2015-03-01 DIAGNOSIS — Z4659 Encounter for fitting and adjustment of other gastrointestinal appliance and device: Secondary | ICD-10-CM

## 2015-03-01 DIAGNOSIS — I69393 Ataxia following cerebral infarction: Secondary | ICD-10-CM

## 2015-03-01 DIAGNOSIS — E119 Type 2 diabetes mellitus without complications: Secondary | ICD-10-CM | POA: Diagnosis present

## 2015-03-01 DIAGNOSIS — R569 Unspecified convulsions: Secondary | ICD-10-CM | POA: Diagnosis not present

## 2015-03-01 DIAGNOSIS — R570 Cardiogenic shock: Secondary | ICD-10-CM | POA: Diagnosis present

## 2015-03-01 DIAGNOSIS — Z978 Presence of other specified devices: Secondary | ICD-10-CM

## 2015-03-01 DIAGNOSIS — D649 Anemia, unspecified: Secondary | ICD-10-CM | POA: Diagnosis present

## 2015-03-01 DIAGNOSIS — E785 Hyperlipidemia, unspecified: Secondary | ICD-10-CM | POA: Diagnosis present

## 2015-03-01 DIAGNOSIS — I272 Other secondary pulmonary hypertension: Secondary | ICD-10-CM | POA: Diagnosis present

## 2015-03-01 DIAGNOSIS — K7200 Acute and subacute hepatic failure without coma: Secondary | ICD-10-CM | POA: Diagnosis present

## 2015-03-01 DIAGNOSIS — Z0189 Encounter for other specified special examinations: Secondary | ICD-10-CM

## 2015-03-01 DIAGNOSIS — K759 Inflammatory liver disease, unspecified: Secondary | ICD-10-CM

## 2015-03-01 DIAGNOSIS — Z66 Do not resuscitate: Secondary | ICD-10-CM | POA: Diagnosis present

## 2015-03-01 DIAGNOSIS — I2510 Atherosclerotic heart disease of native coronary artery without angina pectoris: Secondary | ICD-10-CM | POA: Diagnosis present

## 2015-03-01 DIAGNOSIS — Z7982 Long term (current) use of aspirin: Secondary | ICD-10-CM | POA: Diagnosis not present

## 2015-03-01 DIAGNOSIS — I270 Primary pulmonary hypertension: Secondary | ICD-10-CM | POA: Diagnosis not present

## 2015-03-01 DIAGNOSIS — Z8249 Family history of ischemic heart disease and other diseases of the circulatory system: Secondary | ICD-10-CM

## 2015-03-01 DIAGNOSIS — I469 Cardiac arrest, cause unspecified: Secondary | ICD-10-CM | POA: Diagnosis not present

## 2015-03-01 DIAGNOSIS — I214 Non-ST elevation (NSTEMI) myocardial infarction: Principal | ICD-10-CM | POA: Diagnosis present

## 2015-03-01 DIAGNOSIS — Z794 Long term (current) use of insulin: Secondary | ICD-10-CM

## 2015-03-01 DIAGNOSIS — IMO0002 Reserved for concepts with insufficient information to code with codable children: Secondary | ICD-10-CM | POA: Diagnosis present

## 2015-03-01 DIAGNOSIS — E1165 Type 2 diabetes mellitus with hyperglycemia: Secondary | ICD-10-CM | POA: Diagnosis present

## 2015-03-01 DIAGNOSIS — E872 Acidosis, unspecified: Secondary | ICD-10-CM | POA: Diagnosis present

## 2015-03-01 DIAGNOSIS — E876 Hypokalemia: Secondary | ICD-10-CM | POA: Diagnosis present

## 2015-03-01 DIAGNOSIS — E1139 Type 2 diabetes mellitus with other diabetic ophthalmic complication: Secondary | ICD-10-CM | POA: Diagnosis present

## 2015-03-01 DIAGNOSIS — R079 Chest pain, unspecified: Secondary | ICD-10-CM

## 2015-03-01 DIAGNOSIS — E1110 Type 2 diabetes mellitus with ketoacidosis without coma: Secondary | ICD-10-CM

## 2015-03-01 DIAGNOSIS — I959 Hypotension, unspecified: Secondary | ICD-10-CM | POA: Diagnosis not present

## 2015-03-01 DIAGNOSIS — Z515 Encounter for palliative care: Secondary | ICD-10-CM

## 2015-03-01 DIAGNOSIS — R778 Other specified abnormalities of plasma proteins: Secondary | ICD-10-CM

## 2015-03-01 DIAGNOSIS — I213 ST elevation (STEMI) myocardial infarction of unspecified site: Secondary | ICD-10-CM | POA: Diagnosis not present

## 2015-03-01 DIAGNOSIS — R7989 Other specified abnormal findings of blood chemistry: Secondary | ICD-10-CM

## 2015-03-01 DIAGNOSIS — R740 Nonspecific elevation of levels of transaminase and lactic acid dehydrogenase [LDH]: Secondary | ICD-10-CM | POA: Diagnosis present

## 2015-03-01 DIAGNOSIS — I10 Essential (primary) hypertension: Secondary | ICD-10-CM | POA: Diagnosis present

## 2015-03-01 DIAGNOSIS — R7401 Elevation of levels of liver transaminase levels: Secondary | ICD-10-CM

## 2015-03-01 LAB — COMPREHENSIVE METABOLIC PANEL
ALT: 3024 U/L — ABNORMAL HIGH (ref 17–63)
ANION GAP: 19 — AB (ref 5–15)
AST: 2513 U/L — AB (ref 15–41)
Albumin: 2.9 g/dL — ABNORMAL LOW (ref 3.5–5.0)
Alkaline Phosphatase: 118 U/L (ref 38–126)
BILIRUBIN TOTAL: 0.5 mg/dL (ref 0.3–1.2)
BUN: 62 mg/dL — ABNORMAL HIGH (ref 6–20)
CHLORIDE: 98 mmol/L — AB (ref 101–111)
CO2: 19 mmol/L — ABNORMAL LOW (ref 22–32)
Calcium: 8.4 mg/dL — ABNORMAL LOW (ref 8.9–10.3)
Creatinine, Ser: 2.98 mg/dL — ABNORMAL HIGH (ref 0.61–1.24)
GFR, EST AFRICAN AMERICAN: 23 mL/min — AB (ref >60)
GFR, EST NON AFRICAN AMERICAN: 20 mL/min — AB (ref >60)
Glucose, Bld: 362 mg/dL — ABNORMAL HIGH (ref 65–99)
POTASSIUM: 4.9 mmol/L (ref 3.5–5.1)
Sodium: 136 mmol/L (ref 135–145)
TOTAL PROTEIN: 6.8 g/dL (ref 6.5–8.1)

## 2015-03-01 LAB — CBC WITH DIFFERENTIAL/PLATELET
BASOS ABS: 0 10*3/uL (ref 0.0–0.1)
Basophils Relative: 0 % (ref 0–1)
Eosinophils Absolute: 0 10*3/uL (ref 0.0–0.7)
Eosinophils Relative: 0 % (ref 0–5)
HEMATOCRIT: 28.7 % — AB (ref 39.0–52.0)
Hemoglobin: 9.8 g/dL — ABNORMAL LOW (ref 13.0–17.0)
LYMPHS ABS: 1.5 10*3/uL (ref 0.7–4.0)
Lymphocytes Relative: 32 % (ref 12–46)
MCH: 31.8 pg (ref 26.0–34.0)
MCHC: 34.1 g/dL (ref 30.0–36.0)
MCV: 93.2 fL (ref 78.0–100.0)
Monocytes Absolute: 0.4 10*3/uL (ref 0.1–1.0)
Monocytes Relative: 7 % (ref 3–12)
NEUTROS PCT: 61 % (ref 43–77)
Neutro Abs: 2.9 10*3/uL (ref 1.7–7.7)
PLATELETS: 212 10*3/uL (ref 150–400)
RBC: 3.08 MIL/uL — ABNORMAL LOW (ref 4.22–5.81)
RDW: 13.7 % (ref 11.5–15.5)
WBC: 4.9 10*3/uL (ref 4.0–10.5)

## 2015-03-01 LAB — URINALYSIS, ROUTINE W REFLEX MICROSCOPIC
Bilirubin Urine: NEGATIVE
Glucose, UA: 100 mg/dL — AB
Ketones, ur: 15 mg/dL — AB
Nitrite: NEGATIVE
PROTEIN: NEGATIVE mg/dL
SPECIFIC GRAVITY, URINE: 1.015 (ref 1.005–1.030)
UROBILINOGEN UA: 1 mg/dL (ref 0.0–1.0)
pH: 5 (ref 5.0–8.0)

## 2015-03-01 LAB — PROTIME-INR
INR: 1.68 — AB (ref 0.00–1.49)
PROTHROMBIN TIME: 19.8 s — AB (ref 11.6–15.2)

## 2015-03-01 LAB — I-STAT CHEM 8, ED
BUN: 56 mg/dL — AB (ref 6–20)
CALCIUM ION: 1.05 mmol/L — AB (ref 1.13–1.30)
CHLORIDE: 101 mmol/L (ref 101–111)
Creatinine, Ser: 2.8 mg/dL — ABNORMAL HIGH (ref 0.61–1.24)
Glucose, Bld: 362 mg/dL — ABNORMAL HIGH (ref 65–99)
HEMATOCRIT: 30 % — AB (ref 39.0–52.0)
Hemoglobin: 10.2 g/dL — ABNORMAL LOW (ref 13.0–17.0)
POTASSIUM: 4.8 mmol/L (ref 3.5–5.1)
SODIUM: 136 mmol/L (ref 135–145)
TCO2: 20 mmol/L (ref 0–100)

## 2015-03-01 LAB — BASIC METABOLIC PANEL
BUN: 51 mg/dL — AB (ref 4–21)
CREATININE: 2.5 mg/dL — AB (ref <=1.3)
Glucose: 361 mg/dL
Potassium: 6.2 mmol/L — AB (ref 3.4–5.3)
SODIUM: 135 mmol/L — AB (ref 137–147)

## 2015-03-01 LAB — CBG MONITORING, ED
GLUCOSE-CAPILLARY: 275 mg/dL — AB (ref 65–99)
Glucose-Capillary: 299 mg/dL — ABNORMAL HIGH (ref 65–99)
Glucose-Capillary: 189 mg/dL — ABNORMAL HIGH (ref 65–99)
Glucose-Capillary: 189 mg/dL — ABNORMAL HIGH (ref 65–99)

## 2015-03-01 LAB — I-STAT TROPONIN, ED: TROPONIN I, POC: 20.85 ng/mL — AB (ref 0.00–0.08)

## 2015-03-01 LAB — URINE MICROSCOPIC-ADD ON

## 2015-03-01 LAB — I-STAT CG4 LACTIC ACID, ED
LACTIC ACID, VENOUS: 4.28 mmol/L — AB (ref 0.5–2.0)
Lactic Acid, Venous: 3.07 mmol/L (ref 0.5–2.0)

## 2015-03-01 LAB — TROPONIN I: Troponin I: 19.39 ng/mL (ref <0.031)

## 2015-03-01 LAB — POC OCCULT BLOOD, ED: Fecal Occult Bld: POSITIVE — AB

## 2015-03-01 LAB — LIPASE, BLOOD: LIPASE: 67 U/L — AB (ref 22–51)

## 2015-03-01 LAB — APTT: APTT: 51 s — AB (ref 24–37)

## 2015-03-01 MED ORDER — SODIUM CHLORIDE 0.9 % IV BOLUS (SEPSIS)
500.0000 mL | Freq: Once | INTRAVENOUS | Status: AC
  Administered 2015-03-01: 500 mL via INTRAVENOUS

## 2015-03-01 MED ORDER — SODIUM CHLORIDE 0.9 % IV SOLN
INTRAVENOUS | Status: DC
  Administered 2015-03-01: 2.4 [IU]/h via INTRAVENOUS
  Administered 2015-03-02: 1.9 [IU]/h via INTRAVENOUS
  Administered 2015-03-02: 2 [IU]/h via INTRAVENOUS
  Administered 2015-03-02: 3.8 [IU]/h via INTRAVENOUS
  Administered 2015-03-02: 0.8 [IU]/h via INTRAVENOUS
  Administered 2015-03-02: 1.5 [IU]/h via INTRAVENOUS
  Administered 2015-03-02: 1.8 [IU]/h via INTRAVENOUS
  Administered 2015-03-02: 1.7 [IU]/h via INTRAVENOUS
  Filled 2015-03-01: qty 2.5

## 2015-03-01 MED ORDER — SODIUM CHLORIDE 0.9 % IV SOLN: INTRAVENOUS | Status: DC

## 2015-03-01 MED ORDER — PANTOPRAZOLE SODIUM 40 MG PO TBEC
40.0000 mg | DELAYED_RELEASE_TABLET | Freq: Two times a day (BID) | ORAL | Status: DC
Start: 2015-03-01 — End: 2015-03-04
  Administered 2015-03-01 – 2015-03-02 (×3): 40 mg via ORAL
  Filled 2015-03-01 (×4): qty 1

## 2015-03-01 MED ORDER — SENNOSIDES-DOCUSATE SODIUM 8.6-50 MG PO TABS: 1.0000 | ORAL_TABLET | Freq: Every day | ORAL | Status: DC | PRN

## 2015-03-01 MED ORDER — ATORVASTATIN CALCIUM 10 MG PO TABS
10.0000 mg | ORAL_TABLET | Freq: Every day | ORAL | Status: DC
Start: 2015-03-02 — End: 2015-03-04
  Administered 2015-03-02: 10 mg via ORAL
  Filled 2015-03-01 (×2): qty 1

## 2015-03-01 MED ORDER — TAMSULOSIN HCL 0.4 MG PO CAPS
0.4000 mg | ORAL_CAPSULE | Freq: Every day | ORAL | Status: DC
Start: 2015-03-02 — End: 2015-03-04
  Administered 2015-03-02: 0.4 mg via ORAL
  Filled 2015-03-01: qty 1

## 2015-03-01 MED ORDER — DEXTROSE 5 % IV SOLN
1.0000 g | INTRAVENOUS | Status: DC
  Administered 2015-03-02 – 2015-03-03 (×3): 1 g via INTRAVENOUS
  Filled 2015-03-01 (×3): qty 10

## 2015-03-01 MED ORDER — SACCHAROMYCES BOULARDII 250 MG PO CAPS
250.0000 mg | ORAL_CAPSULE | Freq: Two times a day (BID) | ORAL | Status: DC
Start: 2015-03-02 — End: 2015-03-04
  Administered 2015-03-02 (×2): 250 mg via ORAL
  Filled 2015-03-01 (×2): qty 1

## 2015-03-01 MED ORDER — NITROGLYCERIN 0.4 MG SL SUBL: 0.4000 mg | SUBLINGUAL_TABLET | SUBLINGUAL | Status: DC | PRN

## 2015-03-01 MED ORDER — HEPARIN (PORCINE) IN NACL 100-0.45 UNIT/ML-% IJ SOLN
850.0000 [IU]/h | INTRAMUSCULAR | Status: DC
  Administered 2015-03-01: 850 [IU]/h via INTRAVENOUS
  Filled 2015-03-01: qty 250

## 2015-03-01 MED ORDER — HEPARIN BOLUS VIA INFUSION
4000.0000 [IU] | Freq: Once | INTRAVENOUS | Status: AC
  Administered 2015-03-01: 4000 [IU] via INTRAVENOUS
  Filled 2015-03-01: qty 4000

## 2015-03-01 MED ORDER — ASPIRIN 81 MG PO CHEW
324.0000 mg | CHEWABLE_TABLET | Freq: Once | ORAL | Status: AC
  Administered 2015-03-01: 324 mg via ORAL
  Filled 2015-03-01: qty 4

## 2015-03-01 MED ORDER — CILOSTAZOL 100 MG PO TABS
100.0000 mg | ORAL_TABLET | Freq: Every day | ORAL | Status: DC
Start: 2015-03-02 — End: 2015-03-04
  Administered 2015-03-02: 100 mg via ORAL
  Filled 2015-03-01 (×4): qty 1

## 2015-03-01 MED ORDER — SODIUM CHLORIDE 0.9 % IV BOLUS (SEPSIS)
1000.0000 mL | Freq: Once | INTRAVENOUS | Status: AC
  Administered 2015-03-02: 1000 mL via INTRAVENOUS

## 2015-03-01 MED ORDER — ACETAMINOPHEN 325 MG PO TABS: 650.0000 mg | ORAL_TABLET | Freq: Four times a day (QID) | ORAL | Status: DC | PRN

## 2015-03-01 MED ORDER — ONDANSETRON HCL 4 MG/2ML IJ SOLN: 4.0000 mg | Freq: Four times a day (QID) | INTRAMUSCULAR | Status: DC | PRN

## 2015-03-01 MED ORDER — CLOPIDOGREL BISULFATE 75 MG PO TABS
75.0000 mg | ORAL_TABLET | Freq: Every day | ORAL | Status: DC
  Administered 2015-03-02: 75 mg via ORAL
  Filled 2015-03-01 (×2): qty 1

## 2015-03-01 MED ORDER — ONDANSETRON HCL 4 MG/2ML IJ SOLN
4.0000 mg | Freq: Once | INTRAMUSCULAR | Status: AC
Start: 2015-03-01 — End: 2015-03-01
  Administered 2015-03-01: 4 mg via INTRAVENOUS
  Filled 2015-03-01: qty 2

## 2015-03-01 MED ORDER — ONDANSETRON HCL 4 MG PO TABS: 4.0000 mg | ORAL_TABLET | Freq: Three times a day (TID) | ORAL | Status: DC | PRN

## 2015-03-01 MED ORDER — OXYBUTYNIN CHLORIDE ER 5 MG PO TB24
5.0000 mg | ORAL_TABLET | Freq: Every day | ORAL | Status: DC
  Administered 2015-03-02: 5 mg via ORAL
  Filled 2015-03-01 (×3): qty 1

## 2015-03-01 MED ORDER — ASPIRIN EC 81 MG PO TBEC
81.0000 mg | DELAYED_RELEASE_TABLET | Freq: Every day | ORAL | Status: DC
  Administered 2015-03-02: 81 mg via ORAL
  Filled 2015-03-01 (×2): qty 1

## 2015-03-01 MED ORDER — DEXTROSE-NACL 5-0.45 % IV SOLN
INTRAVENOUS | Status: DC
  Administered 2015-03-01 – 2015-03-03 (×2): via INTRAVENOUS

## 2015-03-01 MED ORDER — CIPROFLOXACIN HCL 500 MG PO TABS: 250.0000 mg | ORAL_TABLET | Freq: Two times a day (BID) | ORAL | Status: DC

## 2015-03-01 MED ORDER — DOCUSATE SODIUM 100 MG PO CAPS
100.0000 mg | ORAL_CAPSULE | Freq: Two times a day (BID) | ORAL | Status: DC
  Administered 2015-03-02 (×2): 100 mg via ORAL
  Filled 2015-03-01 (×3): qty 1

## 2015-03-01 MED ORDER — HEPARIN (PORCINE) IN NACL 100-0.45 UNIT/ML-% IJ SOLN
1300.0000 [IU]/h | INTRAMUSCULAR | Status: DC
  Administered 2015-03-01: 850 [IU]/h via INTRAVENOUS
  Administered 2015-03-02 – 2015-03-03 (×2): 1300 [IU]/h via INTRAVENOUS
  Filled 2015-03-01 (×4): qty 250

## 2015-03-01 NOTE — Progress Notes (Signed)
ANTICOAGULATION CONSULT NOTE - Initial Consult  Pharmacy Consult for Heparin Indication: chest pain/ACS  No Known Allergies  Patient Measurements: Height:  (177.8 cm) Weight: 158 lb (71.668 kg) IBW/kg (Calculated) : 73 Heparin Dosing Weight: 72 kg  Vital Signs: Temp: 99.1 F (37.3 C) (08/24 1718) Temp Source: Oral (08/24 1718) BP: 122/83 mmHg (08/24 1800) Pulse Rate: 105 (08/24 1800)  Labs: No results for input(s): HGB, HCT, PLT, APTT, LABPROT, INR, HEPARINUNFRC, CREATININE, CKTOTAL, CKMB, TROPONINI in the last 72 hours.  CrCl cannot be calculated (Patient has no serum creatinine result on file.).   Medical History: Past Medical History  Diagnosis Date  . Ataxia, late effect of cerebrovascular disease   . Aphasia, late effect of cerebrovascular disease   . Cerebral thrombosis   . Stroke   . Coronary artery disease   . Hypertension   . Diabetes mellitus   . Vitamin D deficiency 06/19/2014  . 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 OA (osteoarthritis) of knee 12/31/2012  . Foot drop 12/31/2012  . BPH (benign prostatic hyperplasia) 06/19/2014    Medications:   (Not in a hospital admission) Scheduled:  . aspirin  324 mg Oral Once   Infusions:    Assessment: 69yo male with history of stroke, CAD, HTN and DM presents from [facility] with generalized weakness, dry cough and emesis. Pharmacy is consulted to dose heparin for ACS/chest pain. Hgb 10.8, Plt 222, sCr 1.22, Trop 20.85.  Goal of Therapy:  Heparin level 0.3-0.7 units/ml Monitor platelets by anticoagulation protocol: Yes   Plan:  Give 4000 units bolus x 1 Start heparin infusion at 850 units/hr Check anti-Xa level in 8 hours and daily while on heparin Continue to monitor H&H and platelets  Arlean Hopping. Newman Pies, PharmD Clinical Pharmacist Pager [phone] 03/05/2015,6:19 PM

## 2015-03-01 NOTE — Consult Note (Signed)
[facility] Consult Note  Gildardo Cranker, DO  Reason for consult: Positive troponin 19.39  History of Present Illness (and review of medical records): Roger Berger is a 69 y.o. male who presents for evaluation from SNF for abnormal labs.  He has known hx of CAD s/p CABG, extensive PAD, HTN, dyslipidemia, hx CVA x 3, DM, Diastolic Dysfunction and was last seen by cardiology in 2014.  He has reported generalized malaise at the SNF for the past 3-4 days.  He was started on antibiotics for UTI.  He had labs drawn which were markedly abnormal and was sent to ED for further evaluation.  He reported having prior chest pain also over the past 3-4 days.  He states this was a pressure like sensation that lasted a few seconds mainly in the evening at rest.  He also had shortness of breath.  Pressure was 7-8/10.  He is currently not having any chest pain.  His labs revealed positive troponin 19.39, along with elevated LFTs, elevated Creatinine, and DKA.  Cardiology was called given troponin elevation and chest pain.  There were no ST elevation or acute ischemic changes on EKG in ED.  Echo 04/2010 Study Conclusions - Left ventricle: The cavity size was normal. Wall thickness was  normal. Systolic function was normal. The estimated ejection  fraction was in the range of 50% to 55%. Features are consistent  with a pseudonormal left ventricular filling pattern, with  concomitant abnormal relaxation and increased filling pressure  (grade 2 diastolic dysfunction). Doppler parameters are consistent  with high ventricular filling pressure. - Regional wall motion abnormality: Possible of the mid  inferolateral myocardium. - Mitral valve: Calcified annulus. Mildly thickened leaflets . - Atrial septum: No defect or patent foramen ovale was identified.  Cath 12/2007 LIMA to LAD, SVG-Diag #1, Left radial to posterolateral branch of RCA, SVG to PDA Patent grafts to all distal targets  except for SVG to OM branch #1, proximally occluded.  Nuclear stress 12/2007 Evidence of moderate to severe ischemia in the basal inferolateral, basal anterolateral, mid inferolateral, mid anterolateral and apical lateral regions. Evidence of significant ischemic in basal inferoseptal, basal inferior, mid inferoseptal, and mid inferior region. Mild perfusion defect seen in apical inferior c/w infarct/scar. EF 56% High risk scan.  Review of Systems A comprehensive review of systems was negative except for: Constitutional: positive for fevers and malaise Gastrointestinal: positive for nausea and vomiting Further review of systems was otherwise negative other than stated in HPI.  Past Medical History  Diagnosis Date  . Ataxia, late effect of cerebrovascular disease   . Aphasia, late effect of cerebrovascular disease   . Cerebral thrombosis   . Stroke   . Coronary artery disease   . Hypertension   . Diabetes mellitus   . Vitamin D deficiency 06/19/2014  . 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 OA (osteoarthritis) of knee 12/31/2012  . Foot drop 12/31/2012  . BPH (benign prostatic hyperplasia) 06/19/2014    Past Surgical History  Procedure Laterality Date  . Coronary artery bypass graft       (Not in a hospital admission) No Known Allergies  Social History  Substance Use Topics  . Smoking status: Former Research scientist (life sciences)  . Smokeless tobacco: Never Used     Comment: quit about 30 years ago.  . Alcohol Use: No    Family History  Problem Relation Age of Onset  . Heart disease Father      Objective:  Patient Vitals for the past 8 hrs:  BP Temp Temp src Pulse Resp SpO2 Height Weight  02/17/2015 2000 119/66 mmHg - - 116 (!) 27 100 % - -  02/28/2015 1900 118/71 mmHg - - 115 (!) 33 99 % - -  02/10/2015 1845 111/87 mmHg - - 112 19 100 % - -  02/26/2015 1830 116/71 mmHg - - 112 16 99 % - -  02/21/2015 1815 134/69 mmHg - - 112 23 97 % - -  02/15/2015 1800 122/83 mmHg - -  105 14 100 % - -  02/16/2015 1730 108/76 mmHg - - 108 15 100 % - -  02/12/2015 1718 127/72 mmHg 99.1 F (37.3 C) Oral 110 (!) 29 98 % - -  02/24/2015 1714 - - - - - - _0  (1.778 m) 71.668 kg (158 lb)  02/14/2015 1712 - - - - - 100 % - -   General appearance: elderly appearing male, no distress Head: Normocephalic, without obvious abnormality, atraumatic Eyes: PERRL, EOM's intact. Neck: no JVD, supple Lungs: clear to auscultation bilaterally Chest wall: no tenderness Heart: tachycardic, regular rhythm, S1, S2 normal Abdomen: soft, non-tender; bowel sounds normal; Extremities:  no or edema Neurologic: awake, alert, and oriented  Results for orders placed or performed during the hospital encounter of 02/17/2015 (from the past 48 hour(s))  I-stat troponin, ED     Status: Abnormal   Collection Time: 02/19/2015  5:59 PM  Result Value Ref Range   Troponin i, poc 20.85 (HH) 0.00 - 0.08 ng/mL   Comment NOTIFIED PHYSICIAN    Comment 3            Comment: Due to the release kinetics of cTnI, a negative result within the first hours of the onset of symptoms does not rule out myocardial infarction with certainty. If myocardial infarction is still suspected, repeat the test at appropriate intervals.   CBG monitoring, ED     Status: Abnormal   Collection Time: 02/08/2015  6:10 PM  Result Value Ref Range   Glucose-Capillary 275 (H) 65 - 99 mg/dL   Comment 1 Notify RN   CBC with Differential     Status: Abnormal   Collection Time: 03/06/2015  6:23 PM  Result Value Ref Range   WBC 4.9 4.0 - 10.5 K/uL   RBC 3.08 (L) 4.22 - 5.81 MIL/uL   Hemoglobin 9.8 (L) 13.0 - 17.0 g/dL   HCT 28.7 (L) 39.0 - 52.0 %   MCV 93.2 78.0 - 100.0 fL   MCH 31.8 26.0 - 34.0 pg   MCHC 34.1 30.0 - 36.0 g/dL   RDW 13.7 11.5 - 15.5 %   Platelets 212 150 - 400 K/uL   Neutrophils Relative % 61 43 - 77 %   Neutro Abs 2.9 1.7 - 7.7 K/uL   Lymphocytes Relative 32 12 - 46 %   Lymphs Abs 1.5 0.7 - 4.0 K/uL   Monocytes Relative 7 3  - 12 %   Monocytes Absolute 0.4 0.1 - 1.0 K/uL   Eosinophils Relative 0 0 - 5 %   Eosinophils Absolute 0.0 0.0 - 0.7 K/uL   Basophils Relative 0 0 - 1 %   Basophils Absolute 0.0 0.0 - 0.1 K/uL  Comprehensive metabolic panel     Status: Abnormal   Collection Time: 02/15/2015  6:23 PM  Result Value Ref Range   Sodium 136 135 - 145 mmol/L   Potassium 4.9 3.5 - 5.1 mmol/L   Chloride 98 (L) 101 -  111 mmol/L   CO2 19 (L) 22 - 32 mmol/L   Glucose, Bld 362 (H) 65 - 99 mg/dL   BUN 62 (H) 6 - 20 mg/dL   Creatinine, Ser 2.98 (H) 0.61 - 1.24 mg/dL   Calcium 8.4 (L) 8.9 - 10.3 mg/dL   Total Protein 6.8 6.5 - 8.1 g/dL   Albumin 2.9 (L) 3.5 - 5.0 g/dL   AST 2513 (H) 15 - 41 U/L    Comment: RESULTS CONFIRMED BY MANUAL DILUTION   ALT 3024 (H) 17 - 63 U/L    Comment: RESULTS CONFIRMED BY MANUAL DILUTION   Alkaline Phosphatase 118 38 - 126 U/L   Total Bilirubin 0.5 0.3 - 1.2 mg/dL   GFR calc non Af Amer 20 (L) >60 mL/min   GFR calc Af Amer 23 (L) >60 mL/min    Comment: (NOTE) The eGFR has been calculated using the CKD EPI equation. This calculation has not been validated in all clinical situations. eGFR's persistently <60 mL/min signify possible Chronic Kidney Disease.    Anion gap 19 (H) 5 - 15  Troponin I     Status: Abnormal   Collection Time: 03/06/2015  6:23 PM  Result Value Ref Range   Troponin I 19.39 (HH) <0.031 ng/mL    Comment:        POSSIBLE MYOCARDIAL ISCHEMIA. SERIAL TESTING RECOMMENDED. CRITICAL RESULT CALLED TO, READ BACK BY AND VERIFIED WITH: Ida Rogue 2000 03/05/2015 D BRADLEY   I-Stat Chem 8, ED     Status: Abnormal   Collection Time: 02/24/2015  6:41 PM  Result Value Ref Range   Sodium 136 135 - 145 mmol/L   Potassium 4.8 3.5 - 5.1 mmol/L   Chloride 101 101 - 111 mmol/L   BUN 56 (H) 6 - 20 mg/dL   Creatinine, Ser 2.80 (H) 0.61 - 1.24 mg/dL   Glucose, Bld 362 (H) 65 - 99 mg/dL   Calcium, Ion 1.05 (L) 1.13 - 1.30 mmol/L   TCO2 20 0 - 100 mmol/L   Hemoglobin 10.2 (L)  13.0 - 17.0 g/dL   HCT 30.0 (L) 39.0 - 52.0 %  POC occult blood, ED     Status: Abnormal   Collection Time: 02/15/2015  6:46 PM  Result Value Ref Range   Fecal Occult Bld POSITIVE (A) NEGATIVE  CBG monitoring, ED     Status: Abnormal   Collection Time: 02/27/2015  7:53 PM  Result Value Ref Range   Glucose-Capillary 299 (H) 65 - 99 mg/dL   Dg Chest Portable 1 View  03/05/2015   CLINICAL DATA:  Short of breath, cough, diabetic  EXAM: PORTABLE CHEST - 1 VIEW  COMPARISON:  07/03/2013  FINDINGS: Sternotomy wires overlies normal cardiac silhouette. There are bilateral small effusions increased from prior. No pulmonary edema or pneumothorax. No focal infiltrate.  IMPRESSION: New bibasilar effusions.   Electronically Signed   By: Suzy Bouchard M.D.   On: 02/12/2015 17:43    ECG:  Sinus tach HR 109, likely prior inferior infarct, NS ST-T changes  Assessment 71 M with hx of coronary artery disease status post 5 vessel CABG in 2000, with 2 patent arterial grafts and one patent vein graft, extensive PAD status post bilateral femoropopliteal with occluded left femoral popliteal graft, Hypertension, Dyslipidemia, History of stroke x3, Diabetes type 2, internal carotid artery disease bilaterally with occluded left internal carotid, Diastolic dysfunction EF 17-79% who presents to ED after abnormal labs at SNF with 3-4 days prior of intermittent chest pain, generalized malaise,  and nausea/vomiting.  Type 1 vs Type 2 MI Possible shock, likely multifactorial AKI with metabolic acidosis Elevated LFTs UTI Diabetes mellitus uncontrolled  Recommendations: --Would recommendation medical management of MI and aggressive supportive care of concurrent active problems with no current indication for urgent invasive assessment. --Continue Heparin gtt likely for 48Hrs, otherwise continue home ASA, Plavix --Trend cardiac biomarkers --Monitor on telemetry, admit to ICU or stepdown --EKG in am and prn for chest  pain --TTE in am assess LV function and wall motion --Volume resuscitation with IVFs, monitor renal function, strict I/Os --Consider GI/Surgical c/s given elevated LFTs and abnormal abdominal US --Management of UTI with antibiotics per primary team --Glucose control as per primary team  Thank you for this consult.  We will be happy to follow along with you and make further recommendations pending clinical course.

## 2015-03-01 NOTE — ED Provider Notes (Signed)
Patient care acquired from [facility], PA-C   Results for orders placed or performed during the hospital encounter of 02/28/2015  CBC with Differential  Result Value Ref Range   WBC 4.9 4.0 - 10.5 K/uL   RBC 3.08 (L) 4.22 - 5.81 MIL/uL   Hemoglobin 9.8 (L) 13.0 - 17.0 g/dL   HCT 16.1 (L) 09.6 - 04.5 %   MCV 93.2 78.0 - 100.0 fL   MCH 31.8 26.0 - 34.0 pg   MCHC 34.1 30.0 - 36.0 g/dL   RDW 40.9 81.1 - 91.4 %   Platelets 212 150 - 400 K/uL   Neutrophils Relative % 61 43 - 77 %   Neutro Abs 2.9 1.7 - 7.7 K/uL   Lymphocytes Relative 32 12 - 46 %   Lymphs Abs 1.5 0.7 - 4.0 K/uL   Monocytes Relative 7 3 - 12 %   Monocytes Absolute 0.4 0.1 - 1.0 K/uL   Eosinophils Relative 0 0 - 5 %   Eosinophils Absolute 0.0 0.0 - 0.7 K/uL   Basophils Relative 0 0 - 1 %   Basophils Absolute 0.0 0.0 - 0.1 K/uL  Comprehensive metabolic panel  Result Value Ref Range   Sodium 136 135 - 145 mmol/L   Potassium 4.9 3.5 - 5.1 mmol/L   Chloride 98 (L) 101 - 111 mmol/L   CO2 19 (L) 22 - 32 mmol/L   Glucose, Bld 362 (H) 65 - 99 mg/dL   BUN 62 (H) 6 - 20 mg/dL   Creatinine, Ser 7.82 (H) 0.61 - 1.24 mg/dL   Calcium 8.4 (L) 8.9 - 10.3 mg/dL   Total Protein 6.8 6.5 - 8.1 g/dL   Albumin 2.9 (L) 3.5 - 5.0 g/dL   AST 9562 (H) 15 - 41 U/L   ALT 3024 (H) 17 - 63 U/L   Alkaline Phosphatase 118 38 - 126 U/L   Total Bilirubin 0.5 0.3 - 1.2 mg/dL   GFR calc non Af Amer 20 (L) >60 mL/min   GFR calc Af Amer 23 (L) >60 mL/min   Anion gap 19 (H) 5 - 15  Urinalysis, Routine w reflex microscopic (not at [facility])  Result Value Ref Range   Color, Urine AMBER (A) YELLOW   APPearance CLOUDY (A) CLEAR   Specific Gravity, Urine 1.015 1.005 - 1.030   pH 5.0 5.0 - 8.0   Glucose, UA 100 (A) NEGATIVE mg/dL   Hgb urine dipstick MODERATE (A) NEGATIVE   Bilirubin Urine NEGATIVE NEGATIVE   Ketones, ur 15 (A) NEGATIVE mg/dL   Protein, ur NEGATIVE NEGATIVE mg/dL   Urobilinogen, UA 1.0 0.0 - 1.0 mg/dL   Nitrite NEGATIVE NEGATIVE    Leukocytes, UA LARGE (A) NEGATIVE  Troponin I  Result Value Ref Range   Troponin I 19.39 (HH) <0.031 ng/mL  Lipase, blood  Result Value Ref Range   Lipase 67 (H) 22 - 51 U/L  Protime-INR  Result Value Ref Range   Prothrombin Time 19.8 (H) 11.6 - 15.2 seconds   INR 1.68 (H) 0.00 - 1.49  APTT  Result Value Ref Range   aPTT 51 (H) 24 - 37 seconds  Urine microscopic-add on  Result Value Ref Range   Squamous Epithelial / LPF RARE RARE   WBC, UA 21-50 <3 WBC/hpf   RBC / HPF 7-10 <3 RBC/hpf   Bacteria, UA MANY (A) RARE   Casts HYALINE CASTS (A) NEGATIVE  I-Stat Chem 8, ED  Result Value Ref Range   Sodium 136 135 - 145  mmol/L   Potassium 4.8 3.5 - 5.1 mmol/L   Chloride 101 101 - 111 mmol/L   BUN 56 (H) 6 - 20 mg/dL   Creatinine, Ser 1.61 (H) 0.61 - 1.24 mg/dL   Glucose, Bld 096 (H) 65 - 99 mg/dL   Calcium, Ion 0.45 (L) 1.13 - 1.30 mmol/L   TCO2 20 0 - 100 mmol/L   Hemoglobin 10.2 (L) 13.0 - 17.0 g/dL   HCT 40.9 (L) 81.1 - 91.4 %  I-stat troponin, ED  Result Value Ref Range   Troponin i, poc 20.85 (HH) 0.00 - 0.08 ng/mL   Comment NOTIFIED PHYSICIAN    Comment 3          CBG monitoring, ED  Result Value Ref Range   Glucose-Capillary 275 (H) 65 - 99 mg/dL   Comment 1 Notify RN   POC occult blood, ED  Result Value Ref Range   Fecal Occult Bld POSITIVE (A) NEGATIVE  I-Stat CG4 Lactic Acid, ED  Result Value Ref Range   Lactic Acid, Venous 4.28 (HH) 0.5 - 2.0 mmol/L   Comment NOTIFIED PHYSICIAN   CBG monitoring, ED  Result Value Ref Range   Glucose-Capillary 299 (H) 65 - 99 mg/dL   Dg Chest Portable 1 View  02/15/2015   CLINICAL DATA:  Short of breath, cough, diabetic  EXAM: PORTABLE CHEST - 1 VIEW  COMPARISON:  07/03/2013  FINDINGS: Sternotomy wires overlies normal cardiac silhouette. There are bilateral small effusions increased from prior. No pulmonary edema or pneumothorax. No focal infiltrate.  IMPRESSION: New bibasilar effusions.   Electronically Signed   By: Genevive Bi M.D.   On: 02/26/2015 17:43   US Abdomen Limited Ruq  02/25/2015   CLINICAL DATA:  Transaminitis.  Vomiting.  EXAM: US ABDOMEN LIMITED - RIGHT UPPER QUADRANT  COMPARISON:  CT abdomen 11/06/2005  FINDINGS: Gallbladder:  Minimally distended with diffuse gallbladder wall thickening, wall measurement of 4-6 mm. In 10 mm stone is seen in the gallbladder lumen. Probable adjacent pericholecystic edema. No sonographic Murphy sign noted.  Common bile duct:  Diameter: 3.3 mm, normal.  Liver:  No focal lesion identified. Within normal limits in parenchymal echogenicity. Normal directional flow in the main portal vein.  Other: Right pleural effusion visualized.  IMPRESSION: 1. Cholelithiasis with diffuse gallbladder wall thickening and probable adjacent pericholecystic edema. No sonographic Murphy sign. Findings may reflect acute versus chronic cholecystitis. No biliary dilatation. Nuclear medicine hepatobiliary scan could be considered for further evaluation based on clinical concern. 2. Right pleural effusion.   Electronically Signed   By: Rubye Oaks M.D.   On: 02/18/2015 21:29    Patient case discussed with Dr. Lindie Spruce of General Surgery who believes transaminitis is unlikely related to cholelithiasis noted on [place]. Does not feel patient needs anything from a surgical standpoint at this time.   Critical Care consulted will see patient in consultation but feels patient can go to stepdown.   Dr. Julian Reil will admit the patient.   Patient d/w with Dr. Blinda Leatherwood, agrees with plan.    Francee Piccolo, PA-C 02/12/2015 2338  Gilda Crease, MD 02/21/2015 [phone]

## 2015-03-01 NOTE — ED Notes (Signed)
Pt ehre from [facility]- Pt reports gen weakness x 3 days, 1 episode of vomiting this am. Facility hcecked labs, K, BUN, Creatinine elevated. Pt to be eval for ARF.

## 2015-03-01 NOTE — ED Notes (Signed)
CHECKED CBG 275

## 2015-03-01 NOTE — ED Provider Notes (Addendum)
Patient presented to the ER with generalized weakness and acute renal failure. Patient has been exhibiting generalized weakness for the last 3 days. He was diagnosed with urinary tract infection, started on antibiotic's. He was given IV fluids. Patient apparently had blood work performed that showed acute renal failure with hyperkalemia, sent to the ER for further evaluation.  Face to face Exam: HEENT - PERRLA Lungs - CTAB Heart - RRR, no M/R/G Abd - S/NT/ND  ED ECG REPORT   Date: 03-20-2015  Rate: 76  Rhythm: normal sinus rhythm  QRS Axis: normal  Intervals: normal  ST/T Wave abnormalities: nonspecific ST/T changes  Conduction Disutrbances:LVH with repol abn  Narrative Interpretation:   Old EKG Reviewed: Diffuse T-wave inversions not seen on previous EKG from April  I have personally reviewed the EKG tracing and agree with the computerized printout as noted.   Patient evaluated for his weakness. He is tachycardic but not hypoxic or hypotensive. He appears to be breathing comfortably and has no complaints of shortness of breath, but does endorse intermittent chest pain. EKG does not show any acute changes, but troponin is markedly elevated. This raises concern for possible recent MI, possibly at the initiation of his symptoms several days ago.  Patient found to have markedly elevated transaminases as well. This could be secondary to acute liver disease such as hepatitis, but gallbladder disease is also considered. Ultrasound performed, does show cholelithiasis but no clear-cut cholecystitis. Discussed briefly with Dr. Lindie Spruce, on-call for general surgery. Confirms that there is nothing surgical at this time.  Patient found to have acute kidney injury. There is a prerenal pattern to this, but there is also suspicion for possible recent hypotension and shock, possibly from recent MI.  Cardiology consultation performed. Recommend initiation of heparinization and continue  monitoring.  Discussed with hospitalist, Dr. Julian Reil, as well as critical care, Dr. Sung Amabile. Patient will be admitted to the stepdown unit by the hospitalist service.  CRITICAL CARE Performed by: Gilda Crease   Total critical care time:  Critical care time was exclusive of separately billable procedures and treating other patients.  Critical care was necessary to treat or prevent imminent or life-threatening deterioration.  Critical care was time spent personally by me on the following activities: development of treatment plan with patient and/or surrogate as well as nursing, discussions with consultants, evaluation of patient's response to treatment, examination of patient, obtaining history from patient or surrogate, ordering and performing treatments and interventions, ordering and review of laboratory studies, ordering and review of radiographic studies, pulse oximetry and re-evaluation of patient's condition.   Gilda Crease, MD 03/20/15 1817  Gilda Crease, MD Mar 20, 2015 1959  Gilda Crease, MD 03/20/15 [phone]

## 2015-03-01 NOTE — H&P (Signed)
[facility] History and Physical  Roger Berger ZOX:[id number] DOB: [birth year] DOA: 02/21/2015  Referring physician: EDP PCP: Kirt Boys, DO   Chief Complaint: Abnormal lab   HPI: Roger Berger is a 69 y.o. male with h/o CAD s/p CABG in 1999, patient is sent in from [facility] with c/o generalized weakness over the past several days.  He had been lethargic, and confused at the NH over the past couple of days with dry cough, single episode of emesis.  They started him on cipro for a UTI.  Labs today demonstrate elevated potassium, BUN, and creatinine.  He was sent in to the hospital.  ROS is positive for CP over the past couple of days that he says is actually gone now.  Per son patient is now more alert and responsive than he had been over the past couple of days.  The results of the ED work up are surprising.  In addition to AKI, the patient also appears to have NSTEMI with troponin of 20 (confirmed on 2 different draws), and what would appear to be shock liver.  His vitals today include mild tachycardia, T of 99.1, he is actually normotensive today with SBP around 120 and MAP in the 70s.  Review of Systems: Systems reviewed.  As above, otherwise negative  Past Medical History  Diagnosis Date  . Ataxia, late effect of cerebrovascular disease   . Aphasia, late effect of cerebrovascular disease   . Cerebral thrombosis   . Stroke   . Coronary artery disease   . Hypertension   . Diabetes mellitus   . Vitamin D deficiency 06/19/2014  . 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 OA (osteoarthritis) of knee 12/31/2012  . Foot drop 12/31/2012  . BPH (benign prostatic hyperplasia) 06/19/2014   Past Surgical History  Procedure Laterality Date  . Coronary artery bypass graft     Social History:  reports that he has quit smoking. He has never used smokeless tobacco. He reports that he does not drink alcohol. His drug history is not on  file.  No Known Allergies  Family History  Problem Relation Age of Onset  . Heart disease Father      Prior to Admission medications   Medication Sig Start Date End Date Taking? Authorizing Provider  acetaminophen (TYLENOL) 325 MG tablet Take 650 mg by mouth every 6 (six) hours as needed for mild pain, fever or headache.   Yes Historical Provider, MD  amLODipine (NORVASC) 5 MG tablet Take 5 mg by mouth daily.   Yes Historical Provider, MD  aspirin 81 MG tablet Take 81 mg by mouth daily.   Yes Historical Provider, MD  atorvastatin (LIPITOR) 10 MG tablet Take 10 mg by mouth daily.   Yes Historical Provider, MD  cilostazol (PLETAL) 100 MG tablet Take 100 mg by mouth daily.    Yes Historical Provider, MD  ciprofloxacin (CIPRO) 250 MG tablet Take 250 mg by mouth 2 (two) times daily.   Yes Historical Provider, MD  clopidogrel (PLAVIX) 75 MG tablet Take 1 tablet by mouth Daily. 05/25/12  Yes Laurena Slimmer, MD  docusate sodium (COLACE) 100 MG capsule Take 100 mg by mouth 2 (two) times daily.   Yes Historical Provider, MD  furosemide (LASIX) 40 MG tablet Take 40 mg by mouth daily.   Yes Historical Provider, MD  glipiZIDE (GLUCOTROL XL) 10 MG 24 hr tablet Take 10 mg by mouth daily with breakfast.  Yes Historical Provider, MD  hydrALAZINE (APRESOLINE) 25 MG tablet Take 25 mg by mouth daily.    Yes Historical Provider, MD  insulin aspart (NOVOLOG FLEXPEN) 100 UNIT/ML FlexPen Inject 10 Units into the skin one time only at 6 PM. Only given one time only on 03/08/2015 per [facility]   Yes Historical Provider, MD  Insulin Detemir (LEVEMIR FLEXPEN) 100 UNIT/ML Pen Inject 10 Units into the skin daily at 6 PM. For diabetes   Yes Historical Provider, MD  metFORMIN (GLUCOPHAGE) 500 MG tablet Take 1,250 mg by mouth 2 (two) times daily with a meal.    Yes Historical Provider, MD  metoprolol (LOPRESSOR) 100 MG tablet Take 100 mg by mouth daily.   Yes Historical Provider, MD  Multiple Vitamins-Minerals (MULTIVITAMIN WITH  MINERALS) tablet Take 1 tablet by mouth daily.   Yes Historical Provider, MD  ondansetron (ZOFRAN) 4 MG tablet Take 1 tablet (4 mg total) by mouth every 8 (eight) hours as needed for nausea or vomiting. 10/26/14  Yes Lenell Antu, MD  oxybutynin (DITROPAN-XL) 5 MG 24 hr tablet Take 5 mg by mouth daily.  04/07/13  Yes Historical Provider, MD  pantoprazole (PROTONIX) 40 MG tablet Take 1 tablet (40 mg total) by mouth 2 (two) times daily. 11/16/14  Yes Sharee Holster, NP  Polyethyl Glycol-Propyl Glycol (SYSTANE) 0.4-0.3 % SOLN Place 1 drop into both eyes every 12 (twelve) hours as needed. For dry eyes   Yes Historical Provider, MD  ramipril (ALTACE) 10 MG capsule TAKE ONE CAPSULE BY MOUTH TWICE DAILY 12/31/12  Yes Chrystie Nose, MD  saccharomyces boulardii (FLORASTOR) 250 MG capsule Take 250 mg by mouth 2 (two) times daily.   Yes Historical Provider, MD  sennosides-docusate sodium (SENOKOT-S) 8.6-50 MG tablet Take 1 tablet by mouth daily as needed for constipation.   Yes Historical Provider, MD  sitaGLIPtin (JANUVIA) 100 MG tablet Take 100 mg by mouth daily.   Yes Historical Provider, MD  tamsulosin (FLOMAX) 0.4 MG CAPS capsule Take 0.4 mg by mouth daily.  04/07/13  Yes Historical Provider, MD   Physical Exam: Filed Vitals:   02/12/2015 2211  BP: 117/62  Pulse: 116  Temp:   Resp: 25    BP 117/62 mmHg  Pulse 116  Temp(Src) 99.1 F (37.3 C) (Oral)  Resp 25  Ht  (1.778 m)  Wt 71.668 kg (158 lb)  BMI 22.67 kg/m2  SpO2 100%  General Appearance:    Alert, oriented, no distress, appears stated age  Head:    Normocephalic, atraumatic  Eyes:    PERRL, EOMI, sclera non-icteric        Nose:   Nares without drainage or epistaxis. Mucosa, turbinates normal  Throat:   Moist mucous membranes. Oropharynx without erythema or exudate.  Neck:   Supple. No carotid bruits.  No thyromegaly.  No lymphadenopathy.   Back:     No CVA tenderness, no spinal tenderness  Lungs:     Clear to auscultation  bilaterally, without wheezes, rhonchi or rales  Chest wall:    No tenderness to palpitation  Heart:    Regular rate and rhythm without murmurs, gallops, rubs  Abdomen:     Soft, non-tender, nondistended, normal bowel sounds, no organomegaly  Genitalia:    deferred  Rectal:    deferred  Extremities:   No clubbing, cyanosis or edema.  Pulses:   2+ and symmetric all extremities  Skin:   Skin color, texture, turgor normal, no rashes or lesions  Lymph nodes:  Cervical, supraclavicular, and axillary nodes normal  Neurologic:   CNII-XII intact. Normal strength, sensation and reflexes      throughout    Labs on Admission:  Basic Metabolic Panel:  Recent Labs Lab 03-28-15 1823 Mar 28, 2015 1841  NA 136 136  K 4.9 4.8  CL 98* 101  CO2 19*  --   GLUCOSE 362* 362*  BUN 62* 56*  CREATININE 2.98* 2.80*  CALCIUM 8.4*  --    Liver Function Tests:  Recent Labs Lab 03-28-2015 1823  AST 2513*  ALT 3024*  ALKPHOS 118  BILITOT 0.5  PROT 6.8  ALBUMIN 2.9*    Recent Labs Lab March 28, 2015 2005  LIPASE 67*   No results for input(s): AMMONIA in the last 168 hours. CBC:  Recent Labs Lab 03/28/2015 1823 03/28/2015 1841  WBC 4.9  --   NEUTROABS 2.9  --   HGB 9.8* 10.2*  HCT 28.7* 30.0*  MCV 93.2  --   PLT 212  --    Cardiac Enzymes:  Recent Labs Lab 03/28/2015 1823  TROPONINI 19.39*    BNP (last 3 results) No results for input(s): PROBNP in the last 8760 hours. CBG:  Recent Labs Lab 03/28/2015 1810 03-28-2015 1953 2015/03/28 2155 28-Mar-2015 2306  GLUCAP 275* 299* 189* 189*    Radiological Exams on Admission: Dg Chest Portable 1 View  03/28/2015   CLINICAL DATA:  Short of breath, cough, diabetic  EXAM: PORTABLE CHEST - 1 VIEW  COMPARISON:  07/03/2013  FINDINGS: Sternotomy wires overlies normal cardiac silhouette. There are bilateral small effusions increased from prior. No pulmonary edema or pneumothorax. No focal infiltrate.  IMPRESSION: New bibasilar effusions.   Electronically  Signed   By: Genevive Bi M.D.   On: 28-Mar-2015 17:43   US Abdomen Limited Ruq  03/28/15   CLINICAL DATA:  Transaminitis.  Vomiting.  EXAM: US ABDOMEN LIMITED - RIGHT UPPER QUADRANT  COMPARISON:  CT abdomen 11/06/2005  FINDINGS: Gallbladder:  Minimally distended with diffuse gallbladder wall thickening, wall measurement of 4-6 mm. In 10 mm stone is seen in the gallbladder lumen. Probable adjacent pericholecystic edema. No sonographic Murphy sign noted.  Common bile duct:  Diameter: 3.3 mm, normal.  Liver:  No focal lesion identified. Within normal limits in parenchymal echogenicity. Normal directional flow in the main portal vein.  Other: Right pleural effusion visualized.  IMPRESSION: 1. Cholelithiasis with diffuse gallbladder wall thickening and probable adjacent pericholecystic edema. No sonographic Murphy sign. Findings may reflect acute versus chronic cholecystitis. No biliary dilatation. Nuclear medicine hepatobiliary scan could be considered for further evaluation based on clinical concern. 2. Right pleural effusion.   Electronically Signed   By: Rubye Oaks M.D.   On: 2015/03/28 21:29    EKG: Independently reviewed.  Assessment/Plan Principal Problem:   Cardiogenic shock Active Problems:   S/P CABG x 5   DM (diabetes mellitus) type II uncontrolled with eye manifestation   Acute kidney failure   NSTEMI (non-ST elevated myocardial infarction)   Shock liver   UTI (lower urinary tract infection)   1. Apparent Cardiogenic shock already resolved on presentation to ED - apparently in addition to UTI, unknown to the NH, patient likely was having cardiogenic shock due to NSTEMI at the same time.  Thankfully and surprisingly, he survived this presumed cardiogenic shock without medical intervention (till this point), and his BP is now normotensive, mental status improved. 2. AKI - 1. Likely pre-renal due to presumed cardiogenic shock (see above) 2. Has compressible IVC on  echo  3. Getting 2nd liter bolus of NS in ED 4. Strict intake and output 5. IVF 6. Repeat CMP in AM 7. Holding lasix and other BP meds 8. Use PRN BP meds as needed, add beta blocker back in first when able to do so 9. Hold ACEi 3. NSTEMI - 1. Cards consulted 2. Heparin gtt 3. Continue ASA / Plavix / pleatal for now 4. Continue statin 5. Needs heart cath just as soon as renal recovery is confirmed, hopefully this should be shortly with normotensive BPs 4. DM2 - uncontrolled 1. Insulin gtt 2. Hold home meds 5. UTI - 1. Culture pending 2. Will switch po Cipro to IV rocephin    Code Status: Full Code  Family Communication: Son at bedside Disposition Plan: Admit to inpatient   Time spent: 70 min  GARDNER, JARED M. [facility] Pager [phone]  If 7AM-7PM, please contact the day team taking care of the patient [email or web address] Password [facility] 02/18/2015, 11:12 PM

## 2015-03-01 NOTE — ED Provider Notes (Signed)
CSN: [id number]     Arrival date & time 02/15/2015  1704 History   First MD Initiated Contact with Patient 02/28/2015 1706     Chief Complaint  Patient presents with  . Abnormal Lab     (Consider location/radiation/quality/duration/timing/severity/associated sxs/prior Treatment) HPI  Blood pressure 127/72, pulse 110, temperature 99.1 F (37.3 C), temperature source Oral, resp. rate 29, height 5\' 10"  (1.778 m), weight 158 lb (71.668 kg), SpO2 98 %.  Roger Berger is a 69 y.o. male sent from [facility] complaining of generalized weakness over several days with dry cough and single episode of nonbloody, nonbilious, non-coffee ground emesis this a.m. As per paperwork, from SNF patient has elevated potassium, BUN and creatinine. Patient reports a tactile fever, chest discomfort and shortness of breath on review of systems.   PCP: Montez Morita Cards: [facility]  Past Medical History  Diagnosis Date  . Ataxia, late effect of cerebrovascular disease   . Aphasia, late effect of cerebrovascular disease   . Cerebral thrombosis   . Stroke   . Coronary artery disease   . Hypertension   . Diabetes mellitus   . Vitamin D deficiency 06/19/2014  . 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 OA (osteoarthritis) of knee 12/31/2012  . Foot drop 12/31/2012  . BPH (benign prostatic hyperplasia) 06/19/2014   Past Surgical History  Procedure Laterality Date  . Coronary artery bypass graft     Family History  Problem Relation Age of Onset  . Heart disease Father    Social History  Substance Use Topics  . Smoking status: Former Games developer  . Smokeless tobacco: Never Used     Comment: quit about 30 years ago.  . Alcohol Use: No    Review of Systems  10 systems reviewed and found to be negative, except as noted in the HPI.  Allergies  Review of patient's allergies indicates no known allergies.  Home Medications   Prior to Admission medications   Medication Sig Start  Date End Date Taking? Authorizing Provider  acetaminophen (TYLENOL) 325 MG tablet Take 650 mg by mouth every 6 (six) hours as needed for mild pain, fever or headache.   Yes Historical Provider, MD  amLODipine (NORVASC) 5 MG tablet Take 5 mg by mouth daily.   Yes Historical Provider, MD  aspirin 81 MG tablet Take 81 mg by mouth daily.   Yes Historical Provider, MD  atorvastatin (LIPITOR) 10 MG tablet Take 10 mg by mouth daily.   Yes Historical Provider, MD  cilostazol (PLETAL) 100 MG tablet Take 100 mg by mouth daily.    Yes Historical Provider, MD  ciprofloxacin (CIPRO) 250 MG tablet Take 250 mg by mouth 2 (two) times daily.   Yes Historical Provider, MD  clopidogrel (PLAVIX) 75 MG tablet Take 1 tablet by mouth Daily. 05/25/12  Yes Laurena Slimmer, MD  docusate sodium (COLACE) 100 MG capsule Take 100 mg by mouth 2 (two) times daily.   Yes Historical Provider, MD  furosemide (LASIX) 40 MG tablet Take 40 mg by mouth daily.   Yes Historical Provider, MD  glipiZIDE (GLUCOTROL XL) 10 MG 24 hr tablet Take 10 mg by mouth daily with breakfast.   Yes Historical Provider, MD  hydrALAZINE (APRESOLINE) 25 MG tablet Take 25 mg by mouth daily.    Yes Historical Provider, MD  insulin aspart (NOVOLOG FLEXPEN) 100 UNIT/ML FlexPen Inject 10 Units into the skin one time only at 6 PM. Only given one time  only on 03/06/2015 per [facility]   Yes Historical Provider, MD  Insulin Detemir (LEVEMIR FLEXPEN) 100 UNIT/ML Pen Inject 10 Units into the skin daily at 6 PM. For diabetes   Yes Historical Provider, MD  metFORMIN (GLUCOPHAGE) 500 MG tablet Take 1,250 mg by mouth 2 (two) times daily with a meal.    Yes Historical Provider, MD  metoprolol (LOPRESSOR) 100 MG tablet Take 100 mg by mouth daily.   Yes Historical Provider, MD  Multiple Vitamins-Minerals (MULTIVITAMIN WITH MINERALS) tablet Take 1 tablet by mouth daily.   Yes Historical Provider, MD  ondansetron (ZOFRAN) 4 MG tablet Take 1 tablet (4 mg total) by mouth every 8 (eight)  hours as needed for nausea or vomiting. 10/26/14  Yes Lenell Antu, MD  oxybutynin (DITROPAN-XL) 5 MG 24 hr tablet Take 5 mg by mouth daily.  04/07/13  Yes Historical Provider, MD  pantoprazole (PROTONIX) 40 MG tablet Take 1 tablet (40 mg total) by mouth 2 (two) times daily. 11/16/14  Yes Sharee Holster, NP  Polyethyl Glycol-Propyl Glycol (SYSTANE) 0.4-0.3 % SOLN Place 1 drop into both eyes every 12 (twelve) hours as needed. For dry eyes   Yes Historical Provider, MD  ramipril (ALTACE) 10 MG capsule TAKE ONE CAPSULE BY MOUTH TWICE DAILY 12/31/12  Yes Chrystie Nose, MD  saccharomyces boulardii (FLORASTOR) 250 MG capsule Take 250 mg by mouth 2 (two) times daily.   Yes Historical Provider, MD  sennosides-docusate sodium (SENOKOT-S) 8.6-50 MG tablet Take 1 tablet by mouth daily as needed for constipation.   Yes Historical Provider, MD  sitaGLIPtin (JANUVIA) 100 MG tablet Take 100 mg by mouth daily.   Yes Historical Provider, MD  tamsulosin (FLOMAX) 0.4 MG CAPS capsule Take 0.4 mg by mouth daily.  04/07/13  Yes Historical Provider, MD  amLODipine (NORVASC) 10 MG tablet Take 0.5 tablets (5 mg total) by mouth daily. Patient not taking: Reported on 03/02/2015 11/16/14   Sharee Holster, NP   BP 118/71 mmHg  Pulse 115  Temp(Src) 99.1 F (37.3 C) (Oral)  Resp 33  Ht 5\' 10"  (1.778 m)  Wt 158 lb (71.668 kg)  BMI 22.67 kg/m2  SpO2 99% Physical Exam  Constitutional: He is oriented to person, place, and time. He appears well-developed and well-nourished. No distress.  HENT:  Head: Normocephalic and atraumatic.  Mouth/Throat: Oropharynx is clear and moist.  Eyes: Conjunctivae and EOM are normal. Pupils are equal, round, and reactive to light.  No TTP of maxillary or frontal sinuses  No TTP or induration of temporal arteries bilaterally  Neck: Normal range of motion. Neck supple. No JVD present. No tracheal deviation present.  FROM to C-spine. Pt can touch chin to chest without discomfort. No TTP of  midline cervical spine.   Cardiovascular: Normal rate, regular rhythm and intact distal pulses.   Radial pulse equal bilaterally  Pulmonary/Chest: Effort normal and breath sounds normal. No stridor. No respiratory distress. He has no wheezes. He has no rales. He exhibits no tenderness.  Abdominal: Soft. Bowel sounds are normal. He exhibits no distension and no mass. There is no tenderness. There is no rebound and no guarding.  Musculoskeletal: Normal range of motion. He exhibits no edema or tenderness.  No calf asymmetry, superficial collaterals, palpable cords, edema, Homans sign negative bilaterally.    Neurological: He is alert and oriented to person, place, and time. No cranial nerve deficit.  II-Visual fields grossly intact. III/IV/VI-Extraocular movements intact.  Pupils reactive bilaterally. V/VII-Smile symmetric, equal eyebrow raise,  facial sensation intact VIII- Hearing grossly intact IX/X-Normal gag XI-bilateral shoulder shrug XII-midline tongue extension Motor: 5/5 bilaterally with normal tone and bulk Cerebellar: Normal finger-to-nose  and normal heel-to-shin test.   Romberg negative Ambulates with a coordinated gait   Skin: Skin is warm. He is not diaphoretic.  Psychiatric: He has a normal mood and affect.  Nursing note and vitals reviewed.   ED Course  Procedures (including critical care time)  CRITICAL CARE Performed by: Wynetta Emery   Total critical care time: 45  Critical care time was exclusive of separately billable procedures and treating other patients.  Critical care was necessary to treat or prevent imminent or life-threatening deterioration.  Critical care was time spent personally by me on the following activities: development of treatment plan with patient and/or surrogate as well as nursing, discussions with consultants, evaluation of patient's response to treatment, examination of patient, obtaining history from patient or surrogate, ordering  and performing treatments and interventions, ordering and review of laboratory studies, ordering and review of radiographic studies, pulse oximetry and re-evaluation of patient's condition.  Labs Review Labs Reviewed  CBC WITH DIFFERENTIAL/PLATELET - Abnormal; Notable for the following:    RBC 3.08 (*)    Hemoglobin 9.8 (*)    HCT 28.7 (*)    All other components within normal limits  COMPREHENSIVE METABOLIC PANEL - Abnormal; Notable for the following:    Chloride 98 (*)    CO2 19 (*)    Glucose, Bld 362 (*)    BUN 62 (*)    Creatinine, Ser 2.98 (*)    Calcium 8.4 (*)    Albumin 2.9 (*)    AST 2513 (*)    ALT 3024 (*)    GFR calc non Af Amer 20 (*)    GFR calc Af Amer 23 (*)    Anion gap 19 (*)    All other components within normal limits  I-STAT CHEM 8, ED - Abnormal; Notable for the following:    BUN 56 (*)    Creatinine, Ser 2.80 (*)    Glucose, Bld 362 (*)    Calcium, Ion 1.05 (*)    Hemoglobin 10.2 (*)    HCT 30.0 (*)    All other components within normal limits  I-STAT TROPOININ, ED - Abnormal; Notable for the following:    Troponin i, poc 20.85 (*)    All other components within normal limits  POC OCCULT BLOOD, ED - Abnormal; Notable for the following:    Fecal Occult Bld POSITIVE (*)    All other components within normal limits  URINALYSIS, ROUTINE W REFLEX MICROSCOPIC (NOT AT [facility])  HEPARIN LEVEL (UNFRACTIONATED)  CBC  TROPONIN I  LIPASE, BLOOD  PROTIME-INR  APTT  CBG MONITORING, ED  I-STAT CG4 LACTIC ACID, ED    Imaging Review Dg Chest Portable 1 View  March 21, 2015   CLINICAL DATA:  Short of breath, cough, diabetic  EXAM: PORTABLE CHEST - 1 VIEW  COMPARISON:  07/03/2013  FINDINGS: Sternotomy wires overlies normal cardiac silhouette. There are bilateral small effusions increased from prior. No pulmonary edema or pneumothorax. No focal infiltrate.  IMPRESSION: New bibasilar effusions.   Electronically Signed   By: Genevive Bi M.D.   On: March 21, 2015 17:43    I have personally reviewed and evaluated these images and lab results as part of my medical decision-making.   EKG Interpretation   Date/Time:  Wednesday 03-21-15 17:18:02 EDT Ventricular Rate:  109 PR Interval:  134 QRS Duration: 106  QT Interval:  360 QTC Calculation: 485 R Axis:   95 Text Interpretation:  Sinus tachycardia Nonspecific ST and T wave  abnormality No significant change since last tracing Confirmed by POLLINA   MD, CHRISTOPHER [phone]) on 02/15/2015 5:24:06 PM      MDM   Final diagnoses:  Transaminitis  Elevated troponin  Diabetic ketoacidosis without coma associated with type 2 diabetes mellitus    Filed Vitals:   02/11/2015 1815 02/08/2015 1830 02/24/2015 1845 02/10/2015 1900  BP: 134/69 116/71 111/87 118/71  Pulse: 112 112 112 115  Temp:      TempSrc:      Resp: 33  Height:      Weight:      SpO2: 97% 99% 100% 99%    Medications  sodium chloride 0.9 % bolus 500 mL (not administered)  dextrose 5 %-0.45 % sodium chloride infusion (not administered)  insulin regular (NOVOLIN R,HUMULIN R) 250 Units in sodium chloride 0.9 % 250 mL (1 Units/mL) infusion (not administered)  ondansetron (ZOFRAN) injection 4 mg (4 mg Intravenous Given 03/05/2015 1809)  sodium chloride 0.9 % bolus 500 mL (0 mLs Intravenous Stopped 02/28/2015 1932)  aspirin chewable tablet 324 mg (324 mg Oral Given 02/17/2015 1834)  heparin bolus via infusion 4,000 Units (4,000 Units Intravenous Given 03/03/2015 1838)    Roger Berger is a pleasant 69 y.o. male presenting with for evaluation of AKI with hyperkalemia from SNF.  EKG is unchanged however critical result of troponin of 20: From the lab. Patient is given aspirin and heparin per pharmacy consult, discussed with attending who agrees with care plan.  FOBT positive, will hold heparin and recheck lab trop.  Patient has a significant transaminitis with AST of 2315 and ALT of 3024. As per son, no history of any liver issues. Question  if this could be shock liver from MI. Vital signs are stable now however patient may have had an episode of hypotension after the initial cardiac insult.  Repeat abdominal exam remains benign. Will add on lipase, right upper quadrant ultrasound, PTT and INR in addition to lactic acid.   His blood glucose is elevated at 362 with an anion gap of 19, will initiate glucose stabilizer for DKA. Patient's vital signs with only a mild tachycardia, blood pressures remain strong with systolic in the 120s.  Cardiology: Consult from Dr. Terressa Koyanagi pre-shaded: He would recommend continuing with the heparin, recommends frequent CBCs, he will evaluate the patient but this will be a medical admission.   Case signed out to PA Peipenrink at shift change: Plan is to follow-up lipase right upper quadrant ultrasound and then patient will be admitted to a step down bed.  Wynetta Emery, PA-C 03/03/2015 2038  Gilda Crease, MD 02/20/2015 [phone]

## 2015-03-02 ENCOUNTER — Encounter: Payer: Self-pay | Admitting: *Deleted

## 2015-03-02 ENCOUNTER — Inpatient Hospital Stay (HOSPITAL_COMMUNITY): Payer: Medicare Other

## 2015-03-02 ENCOUNTER — Encounter (HOSPITAL_COMMUNITY): Payer: Self-pay | Admitting: *Deleted

## 2015-03-02 DIAGNOSIS — E872 Acidosis, unspecified: Secondary | ICD-10-CM | POA: Diagnosis present

## 2015-03-02 DIAGNOSIS — I10 Essential (primary) hypertension: Secondary | ICD-10-CM

## 2015-03-02 DIAGNOSIS — I213 ST elevation (STEMI) myocardial infarction of unspecified site: Secondary | ICD-10-CM

## 2015-03-02 DIAGNOSIS — R570 Cardiogenic shock: Secondary | ICD-10-CM

## 2015-03-02 DIAGNOSIS — N179 Acute kidney failure, unspecified: Secondary | ICD-10-CM | POA: Diagnosis present

## 2015-03-02 DIAGNOSIS — E119 Type 2 diabetes mellitus without complications: Secondary | ICD-10-CM | POA: Diagnosis present

## 2015-03-02 DIAGNOSIS — N3000 Acute cystitis without hematuria: Secondary | ICD-10-CM

## 2015-03-02 DIAGNOSIS — I214 Non-ST elevation (NSTEMI) myocardial infarction: Principal | ICD-10-CM

## 2015-03-02 LAB — GLUCOSE, CAPILLARY
GLUCOSE-CAPILLARY: 137 mg/dL — AB (ref 65–99)
GLUCOSE-CAPILLARY: 145 mg/dL — AB (ref 65–99)
GLUCOSE-CAPILLARY: 147 mg/dL — AB (ref 65–99)
GLUCOSE-CAPILLARY: 155 mg/dL — AB (ref 65–99)
GLUCOSE-CAPILLARY: 157 mg/dL — AB (ref 65–99)
GLUCOSE-CAPILLARY: 166 mg/dL — AB (ref 65–99)
GLUCOSE-CAPILLARY: 178 mg/dL — AB (ref 65–99)
GLUCOSE-CAPILLARY: 193 mg/dL — AB (ref 65–99)
Glucose-Capillary: 136 mg/dL — ABNORMAL HIGH (ref 65–99)
Glucose-Capillary: 134 mg/dL — ABNORMAL HIGH (ref 65–99)
Glucose-Capillary: 178 mg/dL — ABNORMAL HIGH (ref 65–99)
Glucose-Capillary: 193 mg/dL — ABNORMAL HIGH (ref 65–99)
Glucose-Capillary: 167 mg/dL — ABNORMAL HIGH (ref 65–99)
Glucose-Capillary: 148 mg/dL — ABNORMAL HIGH (ref 65–99)
Glucose-Capillary: 148 mg/dL — ABNORMAL HIGH (ref 65–99)
Glucose-Capillary: 158 mg/dL — ABNORMAL HIGH (ref 65–99)
Glucose-Capillary: 156 mg/dL — ABNORMAL HIGH (ref 65–99)
Glucose-Capillary: 149 mg/dL — ABNORMAL HIGH (ref 65–99)

## 2015-03-02 LAB — COMPREHENSIVE METABOLIC PANEL
ALT: 1894 U/L — ABNORMAL HIGH (ref 17–63)
AST: 1332 U/L — AB (ref 15–41)
Albumin: 2.6 g/dL — ABNORMAL LOW (ref 3.5–5.0)
Alkaline Phosphatase: 106 U/L (ref 38–126)
Anion gap: 14 (ref 5–15)
BUN: 64 mg/dL — AB (ref 6–20)
CO2: 20 mmol/L — ABNORMAL LOW (ref 22–32)
CREATININE: 2.71 mg/dL — AB (ref 0.61–1.24)
Calcium: 7.8 mg/dL — ABNORMAL LOW (ref 8.9–10.3)
Chloride: 106 mmol/L (ref 101–111)
GFR calc Af Amer: 26 mL/min — ABNORMAL LOW (ref >60)
GFR, EST NON AFRICAN AMERICAN: 22 mL/min — AB (ref >60)
Glucose, Bld: 132 mg/dL — ABNORMAL HIGH (ref 65–99)
POTASSIUM: 3.9 mmol/L (ref 3.5–5.1)
Sodium: 140 mmol/L (ref 135–145)
TOTAL PROTEIN: 6.1 g/dL — AB (ref 6.5–8.1)
Total Bilirubin: 0.3 mg/dL (ref 0.3–1.2)

## 2015-03-02 LAB — LACTIC ACID, PLASMA
LACTIC ACID, VENOUS: 3.2 mmol/L — AB (ref 0.5–2.0)
Lactic Acid, Venous: 4.1 mmol/L (ref 0.5–2.0)

## 2015-03-02 LAB — CBC
HCT: 26.9 % — ABNORMAL LOW (ref 39.0–52.0)
HEMOGLOBIN: 9.2 g/dL — AB (ref 13.0–17.0)
MCH: 32.4 pg (ref 26.0–34.0)
MCHC: 34.2 g/dL (ref 30.0–36.0)
MCV: 94.7 fL (ref 78.0–100.0)
Platelets: 216 10*3/uL (ref 150–400)
RBC: 2.84 MIL/uL — ABNORMAL LOW (ref 4.22–5.81)
RDW: 13.8 % (ref 11.5–15.5)
WBC: 7 10*3/uL (ref 4.0–10.5)

## 2015-03-02 LAB — BASIC METABOLIC PANEL
Anion gap: 16 — ABNORMAL HIGH (ref 5–15)
BUN: 58 mg/dL — ABNORMAL HIGH (ref 6–20)
CO2: 22 mmol/L (ref 22–32)
Calcium: 8.2 mg/dL — ABNORMAL LOW (ref 8.9–10.3)
Chloride: 103 mmol/L (ref 101–111)
Creatinine, Ser: 2.62 mg/dL — ABNORMAL HIGH (ref 0.61–1.24)
GFR calc Af Amer: 27 mL/min — ABNORMAL LOW (ref >60)
GFR calc non Af Amer: 23 mL/min — ABNORMAL LOW (ref >60)
Glucose, Bld: 195 mg/dL — ABNORMAL HIGH (ref 65–99)
Potassium: 3.8 mmol/L (ref 3.5–5.1)
Sodium: 141 mmol/L (ref 135–145)

## 2015-03-02 LAB — MRSA PCR SCREENING: MRSA by PCR: POSITIVE — AB

## 2015-03-02 LAB — TROPONIN I
TROPONIN I: 23.39 ng/mL — AB (ref <0.031)
TROPONIN I: 17.53 ng/mL — AB (ref <0.031)
TROPONIN I: 15.24 ng/mL — AB (ref <0.031)

## 2015-03-02 LAB — CBG MONITORING, ED: Glucose-Capillary: 183 mg/dL — ABNORMAL HIGH (ref 65–99)

## 2015-03-02 LAB — HEPARIN LEVEL (UNFRACTIONATED)
HEPARIN UNFRACTIONATED: 0.46 [IU]/mL (ref 0.30–0.70)
Heparin Unfractionated: <0.1 IU/mL — ABNORMAL LOW (ref 0.30–0.70)
Heparin Unfractionated: 0.21 IU/mL — ABNORMAL LOW (ref 0.30–0.70)

## 2015-03-02 MED ORDER — HEPARIN BOLUS VIA INFUSION
2000.0000 [IU] | Freq: Once | INTRAVENOUS | Status: AC
  Administered 2015-03-02: 2000 [IU] via INTRAVENOUS
  Filled 2015-03-02: qty 2000

## 2015-03-02 MED ORDER — CHLORHEXIDINE GLUCONATE CLOTH 2 % EX PADS
6.0000 | MEDICATED_PAD | Freq: Every day | CUTANEOUS | Status: DC
Start: 2015-03-02 — End: 2015-03-04
  Administered 2015-03-02 – 2015-03-03 (×2): 6 via TOPICAL

## 2015-03-02 MED ORDER — PERFLUTREN LIPID MICROSPHERE
1.0000 mL | INTRAVENOUS | Status: AC | PRN
  Administered 2015-03-02: 2 mL via INTRAVENOUS
  Filled 2015-03-02: qty 10

## 2015-03-02 MED ORDER — MUPIROCIN 2 % EX OINT
1.0000 | TOPICAL_OINTMENT | Freq: Two times a day (BID) | CUTANEOUS | Status: DC
Start: 2015-03-02 — End: 2015-03-04
  Administered 2015-03-02 – 2015-03-03 (×4): 1 via NASAL
  Filled 2015-03-02: qty 22

## 2015-03-02 MED ORDER — HEPARIN BOLUS VIA INFUSION
1000.0000 [IU] | Freq: Once | INTRAVENOUS | Status: AC
  Administered 2015-03-02: 1000 [IU] via INTRAVENOUS
  Filled 2015-03-02: qty 1000

## 2015-03-02 MED ORDER — INSULIN ASPART 100 UNIT/ML ~~LOC~~ SOLN
0.0000 [IU] | SUBCUTANEOUS | Status: DC
  Administered 2015-03-02: 2 [IU] via SUBCUTANEOUS
  Administered 2015-03-03: 3 [IU] via SUBCUTANEOUS
  Administered 2015-03-03 (×2): 8 [IU] via SUBCUTANEOUS
  Administered 2015-03-03: 3 [IU] via SUBCUTANEOUS
  Administered 2015-03-03: 15 [IU] via SUBCUTANEOUS
  Administered 2015-03-03: 8 [IU] via SUBCUTANEOUS

## 2015-03-02 NOTE — Progress Notes (Signed)
CRITICAL VALUE ALERT  Critical value received:  Lactic Acid 4.1  Date of notification:  03/02/15  Time of notification:  1125   Critical value read back:Yes.    Nurse who received alert:  Lorin Picket  MD notified (1st page):  Dr Joseph Art  Time of first page:  1219  MD notified (2nd page):  Time of second page:  Responding MD:  Dr. Joseph Art  Time MD responded:  1219

## 2015-03-02 NOTE — Progress Notes (Signed)
Primary cardiology  [place]   PROGRESS NOTE  Subjective:   69 y.o. male with h/o CAD s/p CABG in 1999, patient is sent in from [facility] with c/o generalized weakness over the past several days. He had been lethargic, and confused at the NH over the past couple of days with dry cough, single episode of emesis. They started him on cipro for a UTI. Labs  demonstrated elevated potassium, BUN, and creatinine adn troponin . He was sent in to the hospital.  He reports CP last week    Objective:    Vital Signs:   Temp:  [97.8 F (36.6 C)-99.1 F (37.3 C)] 98 F (36.7 C) (08/25 1211) Pulse Rate:  [105-121] 121 (08/25 1211) Resp:  [12-33] 16 (08/25 1211) BP: (106-134)/(61-88) 133/68 mmHg (08/25 0746) SpO2:  [97 %-100 %] 99 % (08/25 1211) FiO2 (%):  [0 %] 0 % (08/24 2249) Weight:  [69.582 kg (153 lb 6.4 oz)-71.668 kg (158 lb)] 69.582 kg (153 lb 6.4 oz) (08/25 0125)  Last BM Date: 03/02/15   24-hour weight change: Weight change:   Weight trends: Filed Weights   03-05-15 1714 03/02/15 0125  Weight: 71.668 kg (158 lb) 69.582 kg (153 lb 6.4 oz)    Intake/Output:    Total I/O In: -  Out: 350 [Urine:350]   Physical Exam: BP 133/68 mmHg  Pulse 121  Temp(Src) 98 F (36.7 C) (Oral)  Resp 16  Ht 5\' 10"  (1.778 m)  Wt 69.582 kg (153 lb 6.4 oz)  BMI 22.01 kg/m2  SpO2 99%  Wt Readings from Last 3 Encounters:  03/02/15 69.582 kg (153 lb 6.4 oz)  01/23/15 67.586 kg (149 lb)  12/28/14 68.947 kg (152 lb)    General: Vital signs reviewed and noted. Chronically ill appearing man, mild distess   Head: Normocephalic, atraumatic.  Eyes: conjunctivae/corneas clear.  EOM's intact.   Throat: normal  Neck:  normal   Lungs:    few rhonchi  Heart:  RR, tachy, multiple premature beats   Abdomen:  Soft, non-tender, non-distended    Extremities: No edema    Neurologic: A&O X3, CN II - XII are grossly intact.   Psych: Normal     Labs: BMET:  Recent Labs   03/02/15 0257 03/02/15 0855  NA 140 141  K 3.9 3.8  CL 106 103  CO2 20* 22  GLUCOSE 132* 195*  BUN 64* 58*  CREATININE 2.71* 2.62*  CALCIUM 7.8* 8.2*    Liver function tests:  Recent Labs  03/05/15 1823 03/02/15 0257  AST 2513* 1332*  ALT 3024* 1894*  ALKPHOS 118 106  BILITOT 0.5 0.3  PROT 6.8 6.1*  ALBUMIN 2.9* 2.6*    Recent Labs  Mar 05, 2015 2005  LIPASE 67*    CBC:  Recent Labs  03-05-15 1823 2015/03/05 1841 03/02/15 0251  WBC 4.9  --  7.0  NEUTROABS 2.9  --   --   HGB 9.8* 10.2* 9.2*  HCT 28.7* 30.0* 26.9*  MCV 93.2  --  94.7  PLT 212  --  216    Cardiac Enzymes:  Recent Labs  2015-03-05 1823 03/02/15 0220 03/02/15 0855  TROPONINI 19.39* 23.39* 17.53*    Coagulation Studies:  Recent Labs  03/05/15 2005  LABPROT 19.8*  INR 1.68*    Other: Invalid input(s): POCBNP No results for input(s): DDIMER in the last 72 hours. No results for input(s): HGBA1C in the last 72 hours. No results for input(s): CHOL, HDL, LDLCALC, TRIG, CHOLHDL  in the last 72 hours. No results for input(s): TSH, T4TOTAL, T3FREE, THYROIDAB in the last 72 hours.  Invalid input(s): FREET3 No results for input(s): VITAMINB12, FOLATE, FERRITIN, TIBC, IRON, RETICCTPCT in the last 72 hours.   Other results:  EKG  ( personally reviewed )  -sinus tachy, PVCs   Medications:    Infusions: . dextrose 5 % and 0.45% NaCl 100 mL/hr at 02/18/2015 2205  . heparin 1,300 Units/hr (03/02/15 1303)  . insulin (NOVOLIN-R) infusion 1.9 Units/hr (03/02/15 1303)    Scheduled Medications: . aspirin EC  81 mg Oral Daily  . atorvastatin  10 mg Oral Daily  . cefTRIAXone (ROCEPHIN)  IV  1 g Intravenous Q24H  . Chlorhexidine Gluconate Cloth  6 each Topical Q0600  . cilostazol  100 mg Oral Daily  . clopidogrel  75 mg Oral Daily  . docusate sodium  100 mg Oral BID  . mupirocin ointment  1 application Nasal BID  . oxybutynin  5 mg Oral Daily  . pantoprazole  40 mg Oral BID  . saccharomyces  boulardii  250 mg Oral BID  . tamsulosin  0.4 mg Oral Daily    Assessment/ Plan:   Principal Problem:   Cardiogenic shock Active Problems:   S/P CABG x 5   DM (diabetes mellitus) type II uncontrolled with eye manifestation   Acute kidney failure   NSTEMI (non-ST elevated myocardial infarction)   Shock liver   UTI (lower urinary tract infection)   1. CAD :  Clinically , patient had an MI last week  Troponin levels are elevated and trending down slowly, Echo has been done but isnot available yet   No indication for PCI for this very late presentation of the MI  2. Shock - multifactorial likely Likely has cardiogenic shock but also has lactic acidosis Echo pendign  3. Anemia Plans per primary medical team    Disposition:  Length of Stay: 1  Vesta Mixer, Montez Hageman., MD, [facility] 03/02/2015, 1:51 PM Office [phone] Pager [phone]

## 2015-03-02 NOTE — ED Notes (Signed)
Insulin verified with this RN

## 2015-03-02 NOTE — Progress Notes (Signed)
Echocardiogram 2D Echocardiogram with Definity has been performed.  Roger Berger 03/02/2015, 12:21 PM

## 2015-03-02 NOTE — Progress Notes (Signed)
[place] TEAM 1 - Stepdown/ICU TEAM Progress Note  Roger Berger ZOX:[id number] DOB: 1945-11-13 DOA: 03/03/2015 PCP: Kirt Boys, DO  Admit HPI / Brief Narrative: Roger Berger is a 69 y.o.BM patient from [facility] PMHx CVA 3 with residual Ataxia/Aphasia, HTN, occluded Left Carotid artery, CAD s/p CABG in 1999  C/O generalized weakness over the past several days. He had been lethargic, and confused at the NH over the past couple of days with dry cough, single episode of emesis. They started him on cipro for a UTI. Labs today demonstrate elevated potassium, BUN, and creatinine. He was sent in to the hospital.  ROS is positive for CP over the past couple of days that he says is actually gone now. Per son patient is now more alert and responsive than he had been over the past couple of days.  The results of the ED work up are surprising. In addition to AKI, the patient also appears to have NSTEMI with troponin of 20 (confirmed on 2 different draws), and what would appear to be shock liver. His vitals today include mild tachycardia, T of 99.1, he is actually normotensive today with SBP around 120 and MAP in the 70s.  HPI/Subjective: 8/25 A/O 4, states currently negative CP, negative SOB, negative N/V. States initially only had positive SOB, and positive abdominal pain.  Assessment/Plan: Apparent Cardiogenic shock - already resolved on presentation to ED - apparently in addition to UTI, unknown to the NH, patient likely was having cardiogenic shock due to NSTEMI at the same time.  NSTEMI -Cardiac catheterization when more stable? Await cardiology recommendations  -Continue heparin drip -Continue aspirin 81 mg daily -Continue Plavix 75 mg daily -Echocardiogram pending  Essential hypertension  -Holding lasix and other BP meds  Acute renal failure - Likely pre-renal due to presumed cardiogenic shock (see above) -Has compressible IVC on echo -Getting 2nd  liter bolus of NS in ED -Strict intake and output; -  Lactic acidosis  -Continue D5-0.45% saline @125ml /hr -Monitor carefully for fluid overload  DM Type 2 controlled -3/4 hemoglobin A1c= 5.9 -Moderate SSI  UTI -Continue ceftriaxone for 5 day course -Awaiting culture, speciation, sensitivity    Code Status: FULL Family Communication: Son present at time of exam Disposition Plan: Resolution NSTEMI    Consultants: Dr.Philip J Nahser (cardiology)   Procedure/Significant Events:    Culture 8/25 MRSA by PCR positive 8/25 urine pending 8/25 blood pending  Antibiotics: Ceftriaxone 8/24>>  DVT prophylaxis: Heparin drip   Devices    LINES / TUBES:      Continuous Infusions: . dextrose 5 % and 0.45% NaCl 125 mL/hr at 03/02/15 1421  . heparin 1,300 Units/hr (03/02/15 1701)    Objective: VITAL SIGNS: Temp: 98.7 F (37.1 C) (08/25 1948) Temp Source: Oral (08/25 1948) BP: 132/71 mmHg (08/25 1948) Pulse Rate: 131 (08/25 1948) SPO2; FIO2:   Intake/Output Summary (Last 24 hours) at 03/02/15 2036 Last data filed at 03/02/15 1700  Gross per 24 hour  Intake 1454.72 ml  Output    350 ml  Net 1104.72 ml     Exam: General: A/O 4, NAD, No acute respiratory distress Eyes: Negative headache, eye pain, double vision,negative scleral hemorrhage ENT: Negative Runny nose, negative ear pain, negative tinnitus, negative gingival bleeding, Neck:  Negative scars, masses, torticollis, lymphadenopathy, JVD Lungs: Clear to auscultation bilaterally without wheezes or crackles Cardiovascular: Irregular rate and rhythm without murmur gallop or rub normal S1 and S2 Abdomen:negative abdominal pain, negative dysphagia, nondistended, positive soft,  bowel sounds, no rebound, no ascites, no appreciable mass Extremities: No significant cyanosis, clubbing, or edema bilateral lower extremities Psychiatric:  Negative depression, negative anxiety, negative fatigue, negative  mania  Neurologic:  Cranial nerves II through XII intact, tongue/uvula midline, all extremities muscle strength 5/5, sensation intact throughout, negative dysarthria, negative expressive aphasia, mild sided facial drooping negative receptive aphasia. Did not ambulate patient.   Data Reviewed: Basic Metabolic Panel:  Recent Labs Lab 02/23/2015 02/22/2015 1823 03/05/2015 1841 03/02/15 0257 03/02/15 0855  NA 135* 136 136 140 141  K 6.2* 4.9 4.8 3.9 3.8  CL  --  98* 101 106 103  CO2  --  19*  --  20* 22  GLUCOSE  --  362* 362* 132* 195*  BUN 51* 62* 56* 64* 58*  CREATININE 2.5* 2.98* 2.80* 2.71* 2.62*  CALCIUM  --  8.4*  --  7.8* 8.2*   Liver Function Tests:  Recent Labs Lab 02/25/2015 1823 03/02/15 0257  AST 2513* 1332*  ALT 3024* 1894*  ALKPHOS 118 106  BILITOT 0.5 0.3  PROT 6.8 6.1*  ALBUMIN 2.9* 2.6*    Recent Labs Lab 03/05/2015 2005  LIPASE 67*   No results for input(s): AMMONIA in the last 168 hours. CBC:  Recent Labs Lab 02/06/2015 1823 02/24/2015 1841 03/02/15 0251  WBC 4.9  --  7.0  NEUTROABS 2.9  --   --   HGB 9.8* 10.2* 9.2*  HCT 28.7* 30.0* 26.9*  MCV 93.2  --  94.7  PLT 212  --  216   Cardiac Enzymes:  Recent Labs Lab 03/06/2015 1823 03/02/15 0220 03/02/15 0855 03/02/15 1415  TROPONINI 19.39* 23.39* 17.53* 15.24*   BNP (last 3 results) No results for input(s): BNP in the last 8760 hours.  ProBNP (last 3 results) No results for input(s): PROBNP in the last 8760 hours.  CBG:  Recent Labs Lab 03/02/15 1418 03/02/15 1548 03/02/15 1710 03/02/15 1816 03/02/15 1944  GLUCAP 145* 148* 158* 156* 147*    Recent Results (from the past 240 hour(s))  MRSA PCR Screening     Status: Abnormal   Collection Time: 03/02/15  2:27 AM  Result Value Ref Range Status   MRSA by PCR POSITIVE (A) NEGATIVE Final    Comment:        The GeneXpert MRSA Assay (FDA approved for NASAL specimens only), is one component of a comprehensive MRSA  colonization surveillance program. It is not intended to diagnose MRSA infection nor to guide or monitor treatment for MRSA infections. RESULT CALLED TO, READ BACK BY AND VERIFIED WITH: Darlina Guys [id number] /25/16 MKELLY      Studies:  Recent x-ray studies have been reviewed in detail by the Attending Physician  Scheduled Meds:  Scheduled Meds: . aspirin EC  81 mg Oral Daily  . atorvastatin  10 mg Oral Daily  . cefTRIAXone (ROCEPHIN)  IV  1 g Intravenous Q24H  . Chlorhexidine Gluconate Cloth  6 each Topical Q0600  . cilostazol  100 mg Oral Daily  . clopidogrel  75 mg Oral Daily  . docusate sodium  100 mg Oral BID  . insulin aspart  0-15 Units Subcutaneous 6 times per day  . mupirocin ointment  1 application Nasal BID  . oxybutynin  5 mg Oral Daily  . pantoprazole  40 mg Oral BID  . saccharomyces boulardii  250 mg Oral BID  . tamsulosin  0.4 mg Oral Daily    Time spent on care of this patient: 40 mins  Drema Dallas , MD  [facility] Office  [phone] Pager [phone]  On-Call/Text Page:      [email or web address]      password [email or web address]  If 7PM-7AM, please contact night-coverage [email or web address] Password [facility] 03/02/2015, 8:36 PM   LOS: 1 day   Care during the described time interval was provided by me .  I have reviewed this patient's available data, including medical history, events of note, physical examination, and all test results as part of my evaluation. I have personally reviewed and interpreted all radiology studies.   Carolyne Littles, MD [phone] Pager

## 2015-03-02 NOTE — Progress Notes (Signed)
CRITICAL VALUE ALERT  Critical value received:  Lactic Acid 3.2   Date of notification:  03/02/2015  Time of notification:  2305  Critical value read back:Yes.    Nurse who received alert:M. Cornelia Copa, RN  MD notified (1st page):  [facility] Midlevel provider  Time of first page:  2306  MD notified (2nd page):  Time of second page:  Responding MD:    Time MD responded:    Lactic Acid level is trending down.

## 2015-03-02 NOTE — Progress Notes (Addendum)
ANTICOAGULATION CONSULT NOTE Pharmacy Consult for Heparin Indication: chest pain/ACS  No Known Allergies  Patient Measurements: Height:  (177.8 cm) Weight: 153 lb 6.4 oz (69.582 kg) IBW/kg (Calculated) : 73 Heparin Dosing Weight: 72 kg  Vital Signs: Temp: 97.8 F (36.6 C) (08/25 0333) Temp Source: Oral (08/25 0333) BP: 114/69 mmHg (08/25 0333) Pulse Rate: 117 (08/25 0333)  Labs:  Recent Labs  March 10, 2015 1823 2015/03/10 1841 Mar 10, 2015 2005 03/02/15 0220 03/02/15 0251 03/02/15 0257  HGB 9.8* 10.2*  --   --  9.2*  --   HCT 28.7* 30.0*  --   --  26.9*  --   PLT 212  --   --   --  216  --   APTT  --   --  51*  --   --   --   LABPROT  --   --  19.8*  --   --   --   INR  --   --  1.68*  --   --   --   HEPARINUNFRC  --   --   --   --   --  <0.10*  CREATININE 2.98* 2.80*  --   --   --   --   TROPONINI 19.39*  --   --  23.39*  --   --     Estimated Creatinine Clearance: 23.8 mL/min (by C-G formula based on Cr of 2.8).  Assessment: 69 y.o. male with chest pain, elevated cardiac markers,  for heparin  Goal of Therapy:  Heparin level 0.3-0.7 units/ml Monitor platelets by anticoagulation protocol: Yes   Plan:  Heparin 2000 units IV bolus, then increase heparin 1150 units/hr Check heparin level in 8 hours.   Geannie Risen, PharmD, BCPS  03/02/2015,4:22 AM  Addendum: HL remains sub-therapeutic at 0.21 on 1150 units/hr. RN reports no s/s of bleeding. Give heparin 1000 units bolus, then increase heparin infusion to 1300 units/hr. F/u 8 hr HL  Vinnie Level, PharmD., BCPS Clinical Pharmacist Pager [phone]

## 2015-03-02 NOTE — Progress Notes (Signed)
ANTICOAGULATION CONSULT NOTE - FOLLOW UP    HL = 0.46 (goal 0.3 - 0.7 units/mL) Heparin dosing weight = 72 kg   Assessment: 69 YOM with ACS to continue IV heparin.  Heparin level therapeutic; no bleeding reported.  No plan for PCI per Cards.   Plan: - Continue heparin gtt at 1300 units/hr - F/U AM labs and anticoagulation plan    Katja Blue D. Laney Potash, PharmD, BCPS 03/02/2015, 9:42 PM

## 2015-03-03 ENCOUNTER — Inpatient Hospital Stay (HOSPITAL_COMMUNITY): Payer: Medicare Other

## 2015-03-03 DIAGNOSIS — I5021 Acute systolic (congestive) heart failure: Secondary | ICD-10-CM | POA: Diagnosis present

## 2015-03-03 DIAGNOSIS — I270 Primary pulmonary hypertension: Secondary | ICD-10-CM

## 2015-03-03 DIAGNOSIS — E876 Hypokalemia: Secondary | ICD-10-CM | POA: Diagnosis present

## 2015-03-03 DIAGNOSIS — R569 Unspecified convulsions: Secondary | ICD-10-CM | POA: Diagnosis present

## 2015-03-03 DIAGNOSIS — I272 Pulmonary hypertension, unspecified: Secondary | ICD-10-CM | POA: Diagnosis present

## 2015-03-03 LAB — CBC
HCT: 27 % — ABNORMAL LOW (ref 39.0–52.0)
Hemoglobin: 9.1 g/dL — ABNORMAL LOW (ref 13.0–17.0)
MCH: 31.3 pg (ref 26.0–34.0)
MCHC: 33.7 g/dL (ref 30.0–36.0)
MCV: 92.8 fL (ref 78.0–100.0)
PLATELETS: 249 10*3/uL (ref 150–400)
RBC: 2.91 MIL/uL — AB (ref 4.22–5.81)
RDW: 13.7 % (ref 11.5–15.5)
WBC: 8.4 10*3/uL (ref 4.0–10.5)

## 2015-03-03 LAB — COMPREHENSIVE METABOLIC PANEL
ALBUMIN: 2.5 g/dL — AB (ref 3.5–5.0)
ALK PHOS: 118 U/L (ref 38–126)
ALT: 2262 U/L — AB (ref 17–63)
ANION GAP: 13 (ref 5–15)
AST: 1226 U/L — ABNORMAL HIGH (ref 15–41)
BILIRUBIN TOTAL: 0.4 mg/dL (ref 0.3–1.2)
BUN: 55 mg/dL — AB (ref 6–20)
CALCIUM: 7.7 mg/dL — AB (ref 8.9–10.3)
CO2: 20 mmol/L — ABNORMAL LOW (ref 22–32)
CREATININE: 2.46 mg/dL — AB (ref 0.61–1.24)
Chloride: 102 mmol/L (ref 101–111)
GFR calc Af Amer: 29 mL/min — ABNORMAL LOW (ref >60)
GFR calc non Af Amer: 25 mL/min — ABNORMAL LOW (ref >60)
GLUCOSE: 240 mg/dL — AB (ref 65–99)
Potassium: 3.3 mmol/L — ABNORMAL LOW (ref 3.5–5.1)
Sodium: 135 mmol/L (ref 135–145)
TOTAL PROTEIN: 6 g/dL — AB (ref 6.5–8.1)

## 2015-03-03 LAB — GLUCOSE, CAPILLARY
GLUCOSE-CAPILLARY: 352 mg/dL — AB (ref 65–99)
GLUCOSE-CAPILLARY: 283 mg/dL — AB (ref 65–99)
Glucose-Capillary: 183 mg/dL — ABNORMAL HIGH (ref 65–99)
Glucose-Capillary: 253 mg/dL — ABNORMAL HIGH (ref 65–99)
Glucose-Capillary: 297 mg/dL — ABNORMAL HIGH (ref 65–99)
Glucose-Capillary: 94 mg/dL (ref 65–99)

## 2015-03-03 LAB — MAGNESIUM
Magnesium: 1.6 mg/dL — ABNORMAL LOW (ref 1.7–2.4)
Magnesium: 2.4 mg/dL (ref 1.7–2.4)

## 2015-03-03 LAB — LACTIC ACID, PLASMA: Lactic Acid, Venous: 3.4 mmol/L (ref 0.5–2.0)

## 2015-03-03 LAB — HEPARIN LEVEL (UNFRACTIONATED): Heparin Unfractionated: 0.42 IU/mL (ref 0.30–0.70)

## 2015-03-03 LAB — POTASSIUM: POTASSIUM: 5.2 mmol/L — AB (ref 3.5–5.1)

## 2015-03-03 MED ORDER — INSULIN ASPART 100 UNIT/ML ~~LOC~~ SOLN: 0.0000 [IU] | SUBCUTANEOUS | Status: DC

## 2015-03-03 MED ORDER — POTASSIUM CHLORIDE 10 MEQ/100ML IV SOLN
10.0000 meq | INTRAVENOUS | Status: AC
  Administered 2015-03-03 (×4): 10 meq via INTRAVENOUS
  Filled 2015-03-03 (×4): qty 100

## 2015-03-03 MED ORDER — SODIUM CHLORIDE 0.9 % IV SOLN: 75.0000 mL/h | INTRAVENOUS | Status: DC

## 2015-03-03 MED ORDER — SODIUM CHLORIDE 0.9 % IV SOLN
500.0000 mg | Freq: Once | INTRAVENOUS | Status: AC
  Administered 2015-03-03: 500 mg via INTRAVENOUS
  Filled 2015-03-03: qty 5

## 2015-03-03 MED ORDER — SODIUM CHLORIDE 0.9 % IV SOLN
500.0000 mg | Freq: Two times a day (BID) | INTRAVENOUS | Status: DC
  Administered 2015-03-03: 500 mg via INTRAVENOUS
  Filled 2015-03-03 (×2): qty 5

## 2015-03-03 MED ORDER — INSULIN DETEMIR 100 UNIT/ML ~~LOC~~ SOLN
10.0000 [IU] | Freq: Every day | SUBCUTANEOUS | Status: DC
  Administered 2015-03-03: 10 [IU] via SUBCUTANEOUS
  Filled 2015-03-03 (×2): qty 0.1

## 2015-03-03 MED ORDER — LORAZEPAM 2 MG/ML IJ SOLN
1.0000 mg | Freq: Once | INTRAMUSCULAR | Status: AC
  Administered 2015-03-03: 1 mg via INTRAVENOUS
  Filled 2015-03-03: qty 1

## 2015-03-03 MED ORDER — MAGNESIUM SULFATE 50 % IJ SOLN
3.0000 g | Freq: Once | INTRAVENOUS | Status: AC
  Administered 2015-03-03: 3 g via INTRAVENOUS
  Filled 2015-03-03: qty 6

## 2015-03-03 MED ORDER — SODIUM CHLORIDE 0.9 % IV SOLN
75.0000 mL/h | INTRAVENOUS | Status: DC
  Administered 2015-03-03: 75 mL/h via INTRAVENOUS

## 2015-03-03 MED ORDER — LORAZEPAM 2 MG/ML IJ SOLN
1.0000 mg | INTRAMUSCULAR | Status: DC | PRN
Start: 2015-03-03 — End: 2015-03-04
  Administered 2015-03-03 (×3): 1 mg via INTRAVENOUS
  Filled 2015-03-03 (×4): qty 1

## 2015-03-03 MED ORDER — THIAMINE HCL 100 MG/ML IJ SOLN
100.0000 mg | Freq: Every day | INTRAMUSCULAR | Status: DC
  Administered 2015-03-03: 100 mg via INTRAVENOUS
  Filled 2015-03-03: qty 2

## 2015-03-03 MED ORDER — SODIUM CHLORIDE 0.9 % IV SOLN: INTRAVENOUS | Status: DC

## 2015-03-03 MED ORDER — FOLIC ACID 5 MG/ML IJ SOLN
1.0000 mg | Freq: Every day | INTRAMUSCULAR | Status: DC
  Administered 2015-03-03: 1 mg via INTRAVENOUS
  Filled 2015-03-03: qty 0.2

## 2015-03-03 NOTE — Progress Notes (Signed)
History: 69 yo M with AMS in the setting of NSTEMI  Sedation: none  Technique: This is a 19 channel routine scalp EEG performed at the bedside with bipolar and monopolar montages arranged in accordance to the international 10/20 system of electrode placement. One channel was dedicated to EKG recording.    Background: The background consists of predominantly sleep. During brief periods of arousal, there continues to be generalized irregular slow activity.  There is a posterior dominant rhythm briefly visualized of 8 Hz.  Photic stimulation: Physiologic driving is not performed  EEG Abnormalities: 1) generalized slow activity  Clinical Interpretation: This EEG is consistent with a generalized non-specific cerebral dysfunction(encephalopathy). There was no seizure or seizure predisposition recorded on this study.   Ritta Slot, MD [facility] [phone]  If 7pm- 7am, please page neurology on call as listed in AMION.

## 2015-03-03 NOTE — Progress Notes (Signed)
Utilization Review Completed.  

## 2015-03-03 NOTE — Progress Notes (Signed)
Went to complete STAT EEG and patient was getting ready to go to CT. Per nurse Junius Roads patient had to go to CT now. EEG to be completed as schedule permits.

## 2015-03-03 NOTE — Progress Notes (Signed)
Dr Joseph Art notified earlier of pt continuing to have seizures- ordered MRI took pt to MRI

## 2015-03-03 NOTE — Progress Notes (Signed)
EEG completed; results pending.  Roger Berger, R.EEGT.  

## 2015-03-03 NOTE — Progress Notes (Signed)
Primary cardiology  Roger Berger   PROGRESS NOTE  Subjective:   69 y.o. male with h/o CAD s/p CABG in 1999, patient is sent in from [facility] with c/o generalized weakness over the past several days. He had been lethargic, and confused at the NH over the past couple of days with dry cough, single episode of emesis. They started him on cipro for a UTI. Labs  demonstrated elevated potassium, BUN, and creatinine adn troponin . He was sent in to the hospital.  He reports CP last week   He had   Objective:    Vital Signs:   Temp:  [97.5 F (36.4 C)-98.7 F (37.1 C)] 98.3 F (36.8 C) (08/26 0400) Pulse Rate:  [112-131] 112 (08/26 0800) Resp:  [9-40] 15 (08/26 0800) BP: (119-137)/(65-88) 119/65 mmHg (08/26 0800) SpO2:  [93 %-100 %] 97 % (08/26 0800)  Last BM Date: 03/02/15   24-hour weight change: Weight change:   Weight trends: Filed Weights   02/13/2015 1714 03/02/15 0125  Weight: 71.668 kg (158 lb) 69.582 kg (153 lb 6.4 oz)    Intake/Output:  08/25 0701 - 08/26 0700 In: 3320.9 [I.V.:3220.9; IV Piggyback:100] Out: 350 [Urine:350]     Physical Exam: BP 119/65 mmHg  Pulse 112  Temp(Src) 98.3 F (36.8 C) (Oral)  Resp 15  Ht 5\' 10"  (1.778 m)  Wt 69.582 kg (153 lb 6.4 oz)  BMI 22.01 kg/m2  SpO2 97%  Wt Readings from Last 3 Encounters:  03/02/15 69.582 kg (153 lb 6.4 oz)  01/23/15 67.586 kg (149 lb)  12/28/14 68.947 kg (152 lb)    General: Vital signs reviewed and noted. Chronically ill appearing man, mild distess   Head: Normocephalic, atraumatic.  Eyes: conjunctivae/corneas clear.  EOM's intact.   Throat: normal  Neck:  normal   Lungs:    few rhonchi  Heart:  RR, tachy, multiple premature beats   Abdomen:  Soft, non-tender, non-distended    Extremities: No edema    Neurologic: A&O X3, CN II - XII are grossly intact.   Psych: Normal     Labs: BMET:  Recent Labs  03/02/15 0855 03/03/15 0236  NA 141 135  K 3.8 3.3*  CL 103 102  CO2 22 20*   GLUCOSE 195* 240*  BUN 58* 55*  CREATININE 2.62* 2.46*  CALCIUM 8.2* 7.7*  MG  --  1.6*    Liver function tests:  Recent Labs  03/02/15 0257 03/03/15 0236  AST 1332* 1226*  ALT 1894* 2262*  ALKPHOS 106 118  BILITOT 0.3 0.4  PROT 6.1* 6.0*  ALBUMIN 2.6* 2.5*    Recent Labs  02/13/2015 2005  LIPASE 67*    CBC:  Recent Labs  02/16/2015 1823  03/02/15 0251 03/03/15 0236  WBC 4.9  --  7.0 8.4  NEUTROABS 2.9  --   --   --   HGB 9.8*  < > 9.2* 9.1*  HCT 28.7*  < > 26.9* 27.0*  MCV 93.2  --  94.7 92.8  PLT 212  --  216 249  < > = values in this interval not displayed.  Cardiac Enzymes:  Recent Labs  02/15/2015 1823 03/02/15 0220 03/02/15 0855 03/02/15 1415  TROPONINI 19.39* 23.39* 17.53* 15.24*    Coagulation Studies:  Recent Labs  03/03/2015 2005  LABPROT 19.8*  INR 1.68*    Other: Invalid input(s): POCBNP No results for input(s): DDIMER in the last 72 hours. No results for input(s): HGBA1C in the last 72  hours. No results for input(s): CHOL, HDL, LDLCALC, TRIG, CHOLHDL in the last 72 hours. No results for input(s): TSH, T4TOTAL, T3FREE, THYROIDAB in the last 72 hours.  Invalid input(s): FREET3 No results for input(s): VITAMINB12, FOLATE, FERRITIN, TIBC, IRON, RETICCTPCT in the last 72 hours.   Other results:  EKG  ( personally reviewed )  -sinus tachy, PVCs   Medications:    Infusions: . sodium chloride 75 mL/hr (03/03/15 1030)  . sodium chloride    . dextrose 5 % and 0.45% NaCl Stopped (03/03/15 1028)  . heparin 1,300 Units/hr (03/02/15 1701)    Scheduled Medications: . aspirin EC  81 mg Oral Daily  . atorvastatin  10 mg Oral Daily  . cefTRIAXone (ROCEPHIN)  IV  1 g Intravenous Q24H  . Chlorhexidine Gluconate Cloth  6 each Topical Q0600  . cilostazol  100 mg Oral Daily  . clopidogrel  75 mg Oral Daily  . docusate sodium  100 mg Oral BID  . folic acid  1 mg Intravenous Daily  . insulin aspart  0-15 Units Subcutaneous 6 times per day    . insulin detemir  10 Units Subcutaneous Daily  . levETIRAcetam  500 mg Intravenous Once  . levETIRAcetam  500 mg Intravenous Q12H  . magnesium sulfate 1 - 4 g bolus IVPB  3 g Intravenous Once  . mupirocin ointment  1 application Nasal BID  . oxybutynin  5 mg Oral Daily  . pantoprazole  40 mg Oral BID  . potassium chloride  10 mEq Intravenous Q1 Hr x 4  . saccharomyces boulardii  250 mg Oral BID  . tamsulosin  0.4 mg Oral Daily  . thiamine  100 mg Intravenous Daily    Assessment/ Plan:   Principal Problem:   Cardiogenic shock Active Problems:   S/P CABG x 5   DM (diabetes mellitus) type II uncontrolled with eye manifestation   Acute kidney failure   NSTEMI (non-ST elevated myocardial infarction)   Shock liver   UTI (lower urinary tract infection)   Essential hypertension   Acute renal failure syndrome   Lactic acidosis   Diabetes type 2, controlled   Acute cystitis without hematuria   1. CAD :  Clinically , patient had an MI last week  Troponin levels are elevated and trending down slowly, Echo has been done but isnot available yet   No indication for PCI for this very late presentation of the MI  2. Shock - multifactorial likely Likely has cardiogenic shock but also has lactic acidosis Echo pendign  3. Anemia Plans per primary medical team    Disposition:  Length of Stay: 2  Alvia Grove., MD, [facility] 03/03/2015, 11:10 AM Office [phone] Pager [phone]

## 2015-03-03 NOTE — Progress Notes (Addendum)
Pt had approx 1 " seizure activity Eyes deviated fixed to upper Left. Nonresponsive twitching noted of shoulders. RR up to 38 labored breathing. MD made aware Medicated with 1 mg of Ativan IV

## 2015-03-03 NOTE — Progress Notes (Signed)
@   1035 Pt had another same episode of seizure activity Dr Joseph Art here & he observed seizure activity. Fixed Gaze to LT & up non responsive to his name being called Given Ativan 1 mg IV

## 2015-03-03 NOTE — Progress Notes (Signed)
[facility] TEAM 1 - Stepdown/ICU TEAM Progress Note  Roger Berger ZOX:[id number] DOB: 12-Oct-1945 DOA: Mar 19, 2015 PCP: Kirt Boys, DO  Admit HPI / Brief Narrative: Roger Berger is a 69 y.o.BM patient from [facility] PMHx CVA 3 with residual Ataxia/Aphasia, HTN, occluded Left Carotid artery, CAD s/p CABG in 1999  C/O generalized weakness over the past several days. He had been lethargic, and confused at the NH over the past couple of days with dry cough, single episode of emesis. They started him on cipro for a UTI. Labs today demonstrate elevated potassium, BUN, and creatinine. He was sent in to the hospital.  ROS is positive for CP over the past couple of days that he says is actually gone now. Per son patient is now more alert and responsive than he had been over the past couple of days.  The results of the ED work up are surprising. In addition to AKI, the patient also appears to have NSTEMI with troponin of 20 (confirmed on 2 different draws), and what would appear to be shock liver. His vitals today include mild tachycardia, T of 99.1, he is actually normotensive today with SBP around 120 and MAP in the 70s.  HPI/Subjective: 8/26 RN Troyce witnessed a tonic-clonic seizure at ~0930 lasting for ~2 minutes. Patient would not respond to name nor follow commands witnessed twitching of limbs.    Assessment/Plan: Apparent Cardiogenic shock - already resolved on presentation to ED - apparently in addition to UTI, unknown to the NH, patient likely was having cardiogenic shock due to NSTEMI at the same time.  NSTEMI -Cardiac catheterization when more stable? Await cardiology recommendations  -Continue heparin drip -Continue aspirin 81 mg daily -Continue Plavix 75 mg daily -Echocardiogram pending  Acute systolic CHF -Spoke with Dr.Philip J Nahser (cardiology); medical management only, secondary to patient's insult occurring sometime last week.  Pulmonary  hypertension -See acute systolic CHF  Essential hypertension  -Holding lasix and other BP meds  Lactic acidosis -Elevated but improving. High of 4.1 now down to 3.4 -Continue normal saline 100 ml/hr -PCXR in a.m.; pulmonary edema?  Acute renal failure - Likely pre-renal due to presumed cardiogenic shock (see above) -Has compressible IVC on echo -Strict intake and output; +3 L  Lactic acidosis  -Continue normal saline @100ml /hr -Monitor carefully for fluid overload  DM Type 2 controlled -3/4 hemoglobin A1c= 5.9 -Increase resistant SSI -Restart Levemir 10 units daily  UTI -Continue ceftriaxone for 5 day course -Awaiting culture, speciation, sensitivity  Hypokalemia -Potassium goal> 4 -Potassium IV 10 mEq 4  Hypomagnesemia -Magnesium goal>2 -Magnesium IV 3 gm  New-onset seizures -Patient has had 2 witnessed seizures this A.m. -Stat head CT pending (patient has had multiple CVA in the past) -Start patient on seizure protocol -Loading dose Keppra IV 500 mg 1, then 500 mg IV BID -Stat EEG ordered -8/26 head CT; old CVAs see report below -8/26 MRI/MRA brain pending -After head CT and EEG obtained will consult neurology    Code Status: FULL Family Communication: Son present at time of exam Disposition Plan: Resolution NSTEMI    Consultants: Dr.Philip J Nahser (cardiology)   Procedure/Significant Events: 8/25 echocardiogram;- LVEF is approximately 10 to 15% with diffuse hypokinesis; apical akinesis. Cavity size was moderatelydilated. Wall thickness was normal. - Right ventricle: Systolic function severely reduced. - Pulmonary arteries: PA peak pressure: 39 mm Hg (S). 8/26 CT head without contrast;-Atrophy, chronic small-vessel ischemic changes -Remote pontine and bilateral thalamic infarcts noted. -No acute intracranial abnormalities are  identified     Culture 8/25 MRSA by PCR positive 8/25 urine pending 8/25 blood  pending  Antibiotics: Ceftriaxone 8/24>>  DVT prophylaxis: Heparin drip   Devices    LINES / TUBES:      Continuous Infusions: . sodium chloride 100 mL/hr at 03/03/15 1811  . heparin 1,300 Units/hr (03/03/15 1300)    Objective: VITAL SIGNS: Temp: 96.5 F (35.8 C) (08/26 2011) Temp Source: Axillary (08/26 2011) BP: 94/54 mmHg (08/26 2011) Pulse Rate: 95 (08/26 2011) SPO2; FIO2:   Intake/Output Summary (Last 24 hours) at 03/03/15 2141 Last data filed at 03/03/15 2100  Gross per 24 hour  Intake 3679.09 ml  Output    200 ml  Net 3479.09 ml     Exam: General: A/O 4, NAD, No acute respiratory distress Eyes: Negative headache, eye pain, double vision,negative scleral hemorrhage ENT: Negative Runny nose, negative ear pain, negative tinnitus, negative gingival bleeding, Neck:  Negative scars, masses, torticollis, lymphadenopathy, JVD Lungs: Clear to auscultation bilaterally without wheezes or crackles Cardiovascular: Irregular rate and rhythm without murmur gallop or rub normal S1 and S2 Abdomen:negative abdominal pain, negative dysphagia, nondistended, positive soft, bowel sounds, no rebound, no ascites, no appreciable mass Extremities: No significant cyanosis, clubbing, or edema bilateral lower extremities Psychiatric:  Negative depression, negative anxiety, negative fatigue, negative mania  Neurologic:  Cranial nerves II through XII intact, tongue/uvula midline, all extremities muscle strength 5/5, sensation intact throughout, negative dysarthria, negative expressive aphasia, mild sided facial drooping negative receptive aphasia. Did not ambulate patient. Addendum; at the end of the neuro exam patient went to a tonic-clonic seizure which lasted for approximately 5 minutes. Patient was post ictal not able to answer questions appropriately. Negative loss of bowel or bladder control.   Data Reviewed: Basic Metabolic Panel:  Recent Labs Lab 02/13/2015 1823  02/20/2015 1841 03/02/15 0257 03/02/15 0855 03/03/15 0236 03/03/15 1932  NA 136 136 140 141 135  --   K 4.9 4.8 3.9 3.8 3.3* 5.2*  CL 98* 101 106 103 102  --   CO2 19*  --  20* 22 20*  --   GLUCOSE 362* 362* 132* 195* 240*  --   BUN 62* 56* 64* 58* 55*  --   CREATININE 2.98* 2.80* 2.71* 2.62* 2.46*  --   CALCIUM 8.4*  --  7.8* 8.2* 7.7*  --   MG  --   --   --   --  1.6* 2.4   Liver Function Tests:  Recent Labs Lab 02/28/2015 1823 03/02/15 0257 03/03/15 0236  AST 2513* 1332* 1226*  ALT 3024* 1894* 2262*  ALKPHOS 118 106 118  BILITOT 0.5 0.3 0.4  PROT 6.8 6.1* 6.0*  ALBUMIN 2.9* 2.6* 2.5*    Recent Labs Lab 02/21/2015 2005  LIPASE 67*   No results for input(s): AMMONIA in the last 168 hours. CBC:  Recent Labs Lab 03/03/2015 1823 02/22/2015 1841 03/02/15 0251 03/03/15 0236  WBC 4.9  --  7.0 8.4  NEUTROABS 2.9  --   --   --   HGB 9.8* 10.2* 9.2* 9.1*  HCT 28.7* 30.0* 26.9* 27.0*  MCV 93.2  --  94.7 92.8  PLT 212  --  216 249   Cardiac Enzymes:  Recent Labs Lab 02/16/2015 1823 03/02/15 0220 03/02/15 0855 03/02/15 1415  TROPONINI 19.39* 23.39* 17.53* 15.24*   BNP (last 3 results) No results for input(s): BNP in the last 8760 hours.  ProBNP (last 3 results) No results for input(s): PROBNP in the last  8760 hours.  CBG:  Recent Labs Lab 03/03/15 0502 03/03/15 0934 03/03/15 1257 03/03/15 1631 03/03/15 2010  GLUCAP 297* 352* 283* 253* 183*    Recent Results (from the past 240 hour(s))  MRSA PCR Screening     Status: Abnormal   Collection Time: 03/02/15  2:27 AM  Result Value Ref Range Status   MRSA by PCR POSITIVE (A) NEGATIVE Final    Comment:        The GeneXpert MRSA Assay (FDA approved for NASAL specimens only), is one component of a comprehensive MRSA colonization surveillance program. It is not intended to diagnose MRSA infection nor to guide or monitor treatment for MRSA infections. RESULT CALLED TO, READ BACK BY AND VERIFIED WITH: Darlina Guys [id number] @08 /25/16 MKELLY   Culture, blood (routine x 2)     Status: None (Preliminary result)   Collection Time: 03/02/15  9:25 PM  Result Value Ref Range Status   Specimen Description BLOOD RIGHT HAND  Final   Special Requests BOTTLES DRAWN AEROBIC AND ANAEROBIC 5CC  Final   Culture NO GROWTH < 24 HOURS  Final   Report Status PENDING  Incomplete  Culture, blood (routine x 2)     Status: None (Preliminary result)   Collection Time: 03/02/15  9:34 PM  Result Value Ref Range Status   Specimen Description BLOOD RIGHT HAND  Final   Special Requests IN PEDIATRIC BOTTLE 1CC  Final   Culture NO GROWTH < 24 HOURS  Final   Report Status PENDING  Incomplete     Studies:  Recent x-ray studies have been reviewed in detail by the Attending Physician  Scheduled Meds:  Scheduled Meds: . aspirin EC  81 mg Oral Daily  . atorvastatin  10 mg Oral Daily  . cefTRIAXone (ROCEPHIN)  IV  1 g Intravenous Q24H  . Chlorhexidine Gluconate Cloth  6 each Topical Q0600  . cilostazol  100 mg Oral Daily  . clopidogrel  75 mg Oral Daily  . docusate sodium  100 mg Oral BID  . folic acid  1 mg Intravenous Daily  . insulin aspart  0-20 Units Subcutaneous 6 times per day  . insulin detemir  10 Units Subcutaneous Daily  . levETIRAcetam  500 mg Intravenous Q12H  . mupirocin ointment  1 application Nasal BID  . oxybutynin  5 mg Oral Daily  . pantoprazole  40 mg Oral BID  . saccharomyces boulardii  250 mg Oral BID  . tamsulosin  0.4 mg Oral Daily  . thiamine  100 mg Intravenous Daily    Time spent on care of this patient: 40 mins   Linnette Panella, Roselind Messier , MD  [facility] Office  [phone] Pager [phone]  On-Call/Text Page:      [email or web address]      password [email or web address]  If 7PM-7AM, please contact night-coverage [email or web address] Password [email or web address] 03/03/2015, 9:41 PM   LOS: 2 days   Care during the described time interval was provided by me .  I have reviewed this patient's available data, including  medical history, events of note, physical examination, and all test results as part of my evaluation. I have personally reviewed and interpreted all radiology studies.   Carolyne Littles, MD [phone] Pager

## 2015-03-03 NOTE — Progress Notes (Signed)
To CT scan with pt.

## 2015-03-03 NOTE — Progress Notes (Signed)
Inpatient Diabetes Program Recommendations  AACE/ADA: New Consensus Statement on Inpatient Glycemic Control (2013)  Target Ranges:  Prepandial:   less than 140 mg/dL      Peak postprandial:   less than 180 mg/dL (1-2 hours)      Critically ill patients:  140 - 180 mg/dL   Results for BRYCIN, Roger Berger (MRN [id number]) as of 03/03/2015 09:30  Ref. Range 03/02/2015 20:49 03/02/2015 23:45 03/03/2015 05:02  Glucose-Capillary Latest Ref Range: 65-99 mg/dL 409 (H) 811 (H) 914 (H)   Diabetes history: DM 2 Outpatient Diabetes medications: Levemir 10 units, Novolog 10 units Qevening, Metformin 1,250 mg BID, Glipizide 10 mg Daily, Januvia 100 mg Daily Current orders for Inpatient glycemic control: Novolog Moderate Q4hrs  Inpatient Diabetes Program Recommendations Insulin - Basal: Glucose 297 this am. Patient never received basal insulin when transitioning off IV insulin yesterday evening. Patient receives 10 units of basal at facility prior to admission. Please consider ordering 10 units of Levemir.  Thanks,  Christena Deem RN, MSN, [facility] Inpatient Diabetes Coordinator Team Pager [phone]

## 2015-03-03 NOTE — Progress Notes (Signed)
Seizure activity noted with twitching of face eyes fixed iin downward gaze and pt nonresponsive to name or noxious stimuli MD made aware

## 2015-03-03 NOTE — Progress Notes (Signed)
Pt having tonic/clonic movements of shoulders and facial twitching. Eyes open - pupils locked and gazing upward and towards the left. No resopnse to verbal stimuli. Seizure activity lasted approx 2 min. 02 applied at 2 L n/c 02 sat 100 % see flowsheet for Vs and neuro checks Dr Joseph Art notified

## 2015-03-03 NOTE — Progress Notes (Signed)
Pt having same symptoms with seizure activity. Awaiting Kepra loading dose IV. MD aware

## 2015-03-03 NOTE — Progress Notes (Signed)
ANTICOAGULATION CONSULT NOTE - Follow-up  Pharmacy Consult for Heparin Indication: chest pain/ACS  No Known Allergies  Patient Measurements: Height:  (177.8 cm) Weight: 153 lb 6.4 oz (69.582 kg) IBW/kg (Calculated) : 73 Heparin Dosing Weight: 72 kg  Vital Signs: Temp: 98.3 F (36.8 C) (08/26 0400) Temp Source: Oral (08/26 0400) BP: 128/88 mmHg (08/26 0400) Pulse Rate: 121 (08/26 0400)  Labs:  Recent Labs  03/05/2015 1823 02/28/2015 1841 02/09/2015 2005 03/02/15 0220 03/02/15 0251  03/02/15 0257 03/02/15 0855 03/02/15 1035 03/02/15 1415 03/02/15 2054 03/03/15 0236  HGB 9.8* 10.2*  --   --  9.2*  --   --   --   --   --   --  9.1*  HCT 28.7* 30.0*  --   --  26.9*  --   --   --   --   --   --  27.0*  PLT 212  --   --   --  216  --   --   --   --   --   --  249  APTT  --   --  51*  --   --   --   --   --   --   --   --   --   LABPROT  --   --  19.8*  --   --   --   --   --   --   --   --   --   INR  --   --  1.68*  --   --   --   --   --   --   --   --   --   HEPARINUNFRC  --   --   --   --   --   < > <0.10*  --  0.21*  --  0.46 0.42  CREATININE 2.98* 2.80*  --   --   --   --  2.71* 2.62*  --   --   --  2.46*  TROPONINI 19.39*  --   --  23.39*  --   --   --  17.53*  --  15.24*  --   --   < > = values in this interval not displayed.  Estimated Creatinine Clearance: 27.1 mL/min (by C-G formula based on Cr of 2.46).  Infusions:  . sodium chloride    . dextrose 5 % and 0.45% NaCl 125 mL/hr at 03/03/15 0154  . heparin 1,300 Units/hr (03/02/15 1701)    Assessment: 69yo male with history of stroke, CAD, HTN and DM presented from [facility] with generalized weakness, dry cough and emesis. Started on IV heparin for ACS/chest pain. Heparin level remains therapeutic. H/H low but stable, no bleeding noted.   Goal of Therapy:  Heparin level 0.3-0.7 units/ml Monitor platelets by anticoagulation protocol: Yes   Plan:  - Continue heparin gtt at 1300 units/hr - Daily  heparin level and CBC - F/u cardiology plans  Lysle Pearl, PharmD, BCPS Pager # [phone] 03/03/2015 10:26 AM

## 2015-03-04 ENCOUNTER — Inpatient Hospital Stay (HOSPITAL_COMMUNITY): Payer: Medicare Other

## 2015-03-04 DIAGNOSIS — R740 Nonspecific elevation of levels of transaminase and lactic acid dehydrogenase [LDH]: Secondary | ICD-10-CM

## 2015-03-04 DIAGNOSIS — R569 Unspecified convulsions: Secondary | ICD-10-CM

## 2015-03-04 LAB — POCT I-STAT 3, ART BLOOD GAS (G3+)
Acid-base deficit: 12 mmol/L — ABNORMAL HIGH (ref 0.0–2.0)
Bicarbonate: 12.4 mEq/L — ABNORMAL LOW (ref 20.0–24.0)
O2 Saturation: 100 %
PCO2 ART: 21.7 mmHg — AB (ref 35.0–45.0)
PH ART: 7.359 (ref 7.350–7.450)
PO2 ART: 243 mmHg — AB (ref 80.0–100.0)
Patient temperature: 96.9
TCO2: 13 mmol/L (ref 0–100)

## 2015-03-04 LAB — URINE CULTURE: CULTURE: NO GROWTH

## 2015-03-04 LAB — HEPARIN LEVEL (UNFRACTIONATED): Heparin Unfractionated: 0.11 IU/mL — ABNORMAL LOW (ref 0.30–0.70)

## 2015-03-04 LAB — GLUCOSE, CAPILLARY
GLUCOSE-CAPILLARY: 88 mg/dL (ref 65–99)
Glucose-Capillary: 86 mg/dL (ref 65–99)

## 2015-03-04 MED ORDER — LORAZEPAM 2 MG/ML PO CONC: 1.0000 mg | ORAL | Status: DC | PRN

## 2015-03-04 MED ORDER — CHLORHEXIDINE GLUCONATE 0.12% ORAL RINSE (MEDLINE KIT): 15.0000 mL | Freq: Two times a day (BID) | OROMUCOSAL | Status: DC

## 2015-03-04 MED ORDER — MIDAZOLAM HCL 2 MG/2ML IJ SOLN
4.0000 mg | Freq: Once | INTRAMUSCULAR | Status: AC
  Administered 2015-03-04: 4 mg via INTRAVENOUS

## 2015-03-04 MED ORDER — NOREPINEPHRINE BITARTRATE 1 MG/ML IV SOLN
0.0000 ug/min | INTRAVENOUS | Status: DC
  Filled 2015-03-04: qty 4

## 2015-03-04 MED ORDER — SODIUM BICARBONATE 8.4 % IV SOLN
50.0000 meq | Freq: Once | INTRAVENOUS | Status: DC
  Administered 2015-03-04: 50 meq via INTRAVENOUS

## 2015-03-04 MED ORDER — FENTANYL CITRATE (PF) 100 MCG/2ML IJ SOLN: 50.0000 ug | INTRAMUSCULAR | Status: DC | PRN

## 2015-03-04 MED ORDER — FAMOTIDINE IN NACL 20-0.9 MG/50ML-% IV SOLN: 20.0000 mg | Freq: Two times a day (BID) | INTRAVENOUS | Status: DC

## 2015-03-04 MED ORDER — MORPHINE SULFATE (PF) 4 MG/ML IV SOLN
8.0000 mg | Freq: Once | INTRAVENOUS | Status: AC
  Administered 2015-03-04: 8 mg via INTRAVENOUS

## 2015-03-04 MED ORDER — ANTISEPTIC ORAL RINSE SOLUTION (CORINZ): 7.0000 mL | Freq: Four times a day (QID) | OROMUCOSAL | Status: DC

## 2015-03-04 MED ORDER — GELATIN ABSORBABLE 12-7 MM EX MISC
1.0000 | Freq: Once | CUTANEOUS | Status: DC
  Filled 2015-03-04: qty 1

## 2015-03-04 MED ORDER — EPINEPHRINE HCL 1 MG/ML IJ SOLN
0.5000 ug/min | INTRAVENOUS | Status: DC
  Administered 2015-03-04: 20 ug/min via INTRAVENOUS
  Filled 2015-03-04: qty 4

## 2015-03-04 MED ORDER — MORPHINE SULFATE (PF) 2 MG/ML IV SOLN: 2.0000 mg | INTRAVENOUS | Status: DC | PRN

## 2015-03-04 MED ORDER — MIDAZOLAM HCL 2 MG/2ML IJ SOLN
INTRAMUSCULAR | Status: AC
  Filled 2015-03-04: qty 6

## 2015-03-04 MED ORDER — MIDAZOLAM HCL 2 MG/2ML IJ SOLN
1.0000 mg | INTRAMUSCULAR | Status: DC | PRN
  Filled 2015-03-04: qty 2

## 2015-03-04 MED ORDER — MIDAZOLAM HCL 2 MG/2ML IJ SOLN
INTRAMUSCULAR | Status: AC
  Administered 2015-03-04: 2 mg
  Filled 2015-03-04: qty 4

## 2015-03-04 MED ORDER — DEXTROSE 5 % IV SOLN
0.5000 ug/min | INTRAVENOUS | Status: DC
  Filled 2015-03-04: qty 4

## 2015-03-04 MED ORDER — STERILE WATER FOR INJECTION IV SOLN
INTRAVENOUS | Status: DC
  Administered 2015-03-04: 03:00:00 via INTRAVENOUS
  Filled 2015-03-04 (×2): qty 850

## 2015-03-04 MED ORDER — SODIUM CHLORIDE 0.9 % IV SOLN
200.0000 mg | Freq: Once | INTRAVENOUS | Status: DC
  Filled 2015-03-04: qty 20

## 2015-03-04 MED ORDER — VANCOMYCIN HCL IN DEXTROSE 750-5 MG/150ML-% IV SOLN: 750.0000 mg | INTRAVENOUS | Status: DC

## 2015-03-04 MED ORDER — SODIUM BICARBONATE 8.4 % IV SOLN
INTRAVENOUS | Status: AC
  Filled 2015-03-04: qty 50

## 2015-03-04 MED ORDER — PIPERACILLIN-TAZOBACTAM 3.375 G IVPB
3.3750 g | Freq: Three times a day (TID) | INTRAVENOUS | Status: DC
  Filled 2015-03-04: qty 50

## 2015-03-04 MED ORDER — MIDAZOLAM HCL 2 MG/2ML IJ SOLN: 1.0000 mg | INTRAMUSCULAR | Status: DC | PRN

## 2015-03-04 MED ORDER — VANCOMYCIN HCL IN DEXTROSE 1-5 GM/200ML-% IV SOLN
1000.0000 mg | INTRAVENOUS | Status: DC
  Filled 2015-03-04: qty 200

## 2015-03-04 MED ORDER — SODIUM BICARBONATE 8.4 % IV SOLN
50.0000 meq | Freq: Once | INTRAVENOUS | Status: DC
  Administered 2015-03-04: 50 meq via INTRAVENOUS
  Filled 2015-03-04: qty 50

## 2015-03-04 MED ORDER — LORAZEPAM 1 MG PO TABS: 2.0000 mg | ORAL_TABLET | ORAL | Status: DC | PRN

## 2015-03-04 MED ORDER — PIPERACILLIN-TAZOBACTAM 3.375 G IVPB 30 MIN
3.3750 g | INTRAVENOUS | Status: DC
  Filled 2015-03-04: qty 50

## 2015-03-04 MED ORDER — FENTANYL CITRATE (PF) 100 MCG/2ML IJ SOLN
INTRAMUSCULAR | Status: AC
  Administered 2015-03-04: 100 ug
  Filled 2015-03-04: qty 4

## 2015-03-04 MED ORDER — MORPHINE SULFATE (PF) 4 MG/ML IV SOLN
INTRAVENOUS | Status: AC
  Filled 2015-03-04: qty 2

## 2015-03-04 MED ORDER — LORAZEPAM 2 MG/ML IJ SOLN: 1.0000 mg | INTRAMUSCULAR | Status: DC | PRN

## 2015-03-04 MED FILL — Medication: Qty: 1 | Status: AC

## 2015-03-07 LAB — CULTURE, BLOOD (ROUTINE X 2)
CULTURE: NO GROWTH
Culture: NO GROWTH

## 2015-03-08 ENCOUNTER — Telehealth: Payer: Self-pay

## 2015-03-08 NOTE — Telephone Encounter (Signed)
On 03/08/2015 I received a death certificate from [facility]. The death certificate is for burial. The patient is a patient of [facility]. The death certificate will be taken to the pulmonary unit for signature this am. Doctor Craige Cotta will be the doctor that signs this death certificate. On March 29, 2015 I received the death certificate from Doctor Ramaswamy. I got the death certificate ready for pickup and called the funeral home to let them know the death certificate was ready for pickup.

## 2015-03-09 ENCOUNTER — Encounter: Payer: Self-pay | Admitting: Internal Medicine

## 2015-03-09 NOTE — Progress Notes (Signed)
Subjective/Objective Roger Berger is an 69 y.o. male hx of CAD s/p CABG in 1999 sent from SNF on 8/24 with generalized weakness for a few days. In ED found to have AKI, shock liver and a NSTEMI with troponin of 20. Was admitted with working diagnosis of cardiogenic shock, AKI, NSTEMI and UTI.  In AM on 8/26 he was noted to have what appeared to be seizure activity. EEG and MRI were done with EEG showing no evidence of seizure activity and MRI negative for acute CVA. He was treated with Keppra.  At around 11 pm [facility] call provider called regarding hypotension. At bedside patient noted to have EKG changes, Kussmaul breathing, atonic seizure activity with right lateral fixed gaze. With resolution of symptoms BP improved and rhythm more regular (sinus rhythm). Neurology consulted and after examination indicated no seizure activity noted - mental status indicated responsive to noxious stimuli only. It appeared symptoms likely related to heart failure (recent echo with EF 10-15%).   Mr. [name] continued with low BP and CCM was contacted to consult. Around 1am BP and HR dropped. Patient was subsequently found to be in PEA arrest. Code blue was subsequently called. He was successfully resuscitated/intubated and transferred to ICU - 46M  Scheduled Meds: . aspirin EC  81 mg Oral Daily  . atorvastatin  10 mg Oral Daily  . cefTRIAXone (ROCEPHIN)  IV  1 g Intravenous Q24H  . Chlorhexidine Gluconate Cloth  6 each Topical Q0600  . cilostazol  100 mg Oral Daily  . clopidogrel  75 mg Oral Daily  . docusate sodium  100 mg Oral BID  . epinephrine  0.5-20 mcg/min Intravenous STAT  . folic acid  1 mg Intravenous Daily  . insulin aspart  0-20 Units Subcutaneous 6 times per day  . insulin detemir  10 Units Subcutaneous Daily  . lacosamide (VIMPAT) IV  200 mg Intravenous Once  . levETIRAcetam  500 mg Intravenous Q12H  . mupirocin ointment  1 application Nasal BID  . oxybutynin  5 mg Oral Daily  .  pantoprazole  40 mg Oral BID  . saccharomyces boulardii  250 mg Oral BID  . tamsulosin  0.4 mg Oral Daily  . thiamine  100 mg Intravenous Daily   Continuous Infusions: . sodium chloride 100 mL/hr at 03/03/15 1811  . heparin 1,300 Units/hr (03/03/15 1300)   PRN Meds:LORazepam, nitroGLYCERIN, ondansetron (ZOFRAN) IV, senna-docusate  Vital signs in last 24 hours: Temp:  [95.8 F (35.4 C)-98.3 F (36.8 C)] 96.5 F (35.8 C) (08/26 2011) Pulse Rate:  [78-121] 95 (08/26 2313) Resp:  [15-43] 35 (08/26 2313) BP: (59-128)/(27-88) 62/37 mmHg (08/26 2313) SpO2:  [93 %-100 %] 100 % (08/26 2313)  Intake/Output last 3 shifts: I/O last 3 completed shifts: In: 5382.9 [P.O.:120; I.V.:4551.9; IV Piggyback:711] Out: 550 [Urine:550] Intake/Output this shift: Total I/O In: 227.9 [I.V.:227.9] Out: -   Problem Assessment/Plan  Heart failure/cardiac arrest: Patient successfully resuscitated after PEA arrest. Transferred and transferred to ICU under CCM direction. Son contacted previously and at bedside.

## 2015-03-09 NOTE — Procedures (Signed)
CENTRAL VENOUS CATHETER INSERTION   Indication: Shock Consent: yes Time out: yes Appropriate position: yes Hand washing: yes Patient Sterilized and Draped: yes Location: Left IJ # of Attempts: 1 Ultrasound Guidance: yes Wire Confirmed with US: yes Insertion depth: 20 cm All Ports Draw and flush: yes CXR:   Pneumothorax: no  Line position appropriate: yes Line sutured in place: yes EBL: 5 cc Complications: no Patient Tolerated Procedure Well: yes     Laurynn Mccorvey, DO., MS [facility]  

## 2015-03-09 NOTE — Procedures (Signed)
Extubation Procedure Note  Patient Details:   Name: Roger Berger DOB: 1946-05-08 MRN: [id number]   Patient extubated per comfort care measures. No stridor or distress noted. RT will continue to monitor as needed.   Evaluation  O2 sats: stable throughout Complications: No apparent complications Patient did tolerate procedure well. Bilateral Breath Sounds: Clear, Diminished    Letta Median 02/21/2015, 3:32 AM

## 2015-03-09 NOTE — H&P (Signed)
PULMONARY / CRITICAL CARE MEDICINE   Name: Roger Berger MRN: [id number] DOB: 12-Sep-1945    ADMISSION DATE:  02/17/2015  CHIEF COMPLAINT:  Encephalopathy  INITIAL PRESENTATION:  Admited to floor for abdominal pain and confusion.  SIGNIFICANT EVENTS: Seizure activity and hypotension of the floor   HISTORY OF PRESENT ILLNESS:   69 yo male with MMP including severe HFrEF, COPD who had presented with abdominal pain and fatigue.  During his admission he had new seizure activity and was seen by neurology.  He underwent MRI and EEG which were unremarkable.  Yesterday he developed progressive hypotension and somnolence.  PCCM was consulted for evaluation.  PAST MEDICAL HISTORY :   has a past medical history of Ataxia, late effect of cerebrovascular disease; Aphasia, late effect of cerebrovascular disease; Cerebral thrombosis; Stroke; Coronary artery disease; Hypertension; Diabetes mellitus; Vitamin D deficiency (06/19/2014); Carotid disease, bilateral (12/31/2012); OA (osteoarthritis) of knee (12/31/2012); Foot drop (12/31/2012); and BPH (benign prostatic hyperplasia) (06/19/2014).  has past surgical history that includes Coronary artery bypass graft. Prior to Admission medications   Medication Sig Start Date End Date Taking? Authorizing Provider  acetaminophen (TYLENOL) 325 MG tablet Take 650 mg by mouth every 6 (six) hours as needed for mild pain, fever or headache.   Yes Historical Provider, MD  amLODipine (NORVASC) 5 MG tablet Take 5 mg by mouth daily.   Yes Historical Provider, MD  aspirin 81 MG tablet Take 81 mg by mouth daily.   Yes Historical Provider, MD  atorvastatin (LIPITOR) 10 MG tablet Take 10 mg by mouth daily.   Yes Historical Provider, MD  cilostazol (PLETAL) 100 MG tablet Take 100 mg by mouth daily.    Yes Historical Provider, MD  ciprofloxacin (CIPRO) 250 MG tablet Take 250 mg by mouth 2 (two) times daily.   Yes Historical Provider, MD  clopidogrel (PLAVIX) 75 MG tablet Take  1 tablet by mouth Daily. 05/25/12  Yes Laurena Slimmer, MD  docusate sodium (COLACE) 100 MG capsule Take 100 mg by mouth 2 (two) times daily.   Yes Historical Provider, MD  furosemide (LASIX) 40 MG tablet Take 40 mg by mouth daily.   Yes Historical Provider, MD  glipiZIDE (GLUCOTROL XL) 10 MG 24 hr tablet Take 10 mg by mouth daily with breakfast.   Yes Historical Provider, MD  hydrALAZINE (APRESOLINE) 25 MG tablet Take 25 mg by mouth daily.    Yes Historical Provider, MD  insulin aspart (NOVOLOG FLEXPEN) 100 UNIT/ML FlexPen Inject 10 Units into the skin one time only at 6 PM. Only given one time only on 03/02/2015 per [facility]   Yes Historical Provider, MD  Insulin Detemir (LEVEMIR FLEXPEN) 100 UNIT/ML Pen Inject 10 Units into the skin daily at 6 PM. For diabetes   Yes Historical Provider, MD  metFORMIN (GLUCOPHAGE) 500 MG tablet Take 1,250 mg by mouth 2 (two) times daily with a meal.    Yes Historical Provider, MD  metoprolol (LOPRESSOR) 100 MG tablet Take 100 mg by mouth daily.   Yes Historical Provider, MD  Multiple Vitamins-Minerals (MULTIVITAMIN WITH MINERALS) tablet Take 1 tablet by mouth daily.   Yes Historical Provider, MD  ondansetron (ZOFRAN) 4 MG tablet Take 1 tablet (4 mg total) by mouth every 8 (eight) hours as needed for nausea or vomiting. 10/26/14  Yes Lenell Antu, MD  oxybutynin (DITROPAN-XL) 5 MG 24 hr tablet Take 5 mg by mouth daily.  04/07/13  Yes Historical Provider, MD  pantoprazole (PROTONIX) 40 MG tablet Take 1 tablet (  40 mg total) by mouth 2 (two) times daily. 11/16/14  Yes Sharee Holster, NP  Polyethyl Glycol-Propyl Glycol (SYSTANE) 0.4-0.3 % SOLN Place 1 drop into both eyes every 12 (twelve) hours as needed. For dry eyes   Yes Historical Provider, MD  ramipril (ALTACE) 10 MG capsule TAKE ONE CAPSULE BY MOUTH TWICE DAILY 12/31/12  Yes Chrystie Nose, MD  saccharomyces boulardii (FLORASTOR) 250 MG capsule Take 250 mg by mouth 2 (two) times daily.   Yes Historical Provider, MD   sennosides-docusate sodium (SENOKOT-S) 8.6-50 MG tablet Take 1 tablet by mouth daily as needed for constipation.   Yes Historical Provider, MD  sitaGLIPtin (JANUVIA) 100 MG tablet Take 100 mg by mouth daily.   Yes Historical Provider, MD  tamsulosin (FLOMAX) 0.4 MG CAPS capsule Take 0.4 mg by mouth daily.  04/07/13  Yes Historical Provider, MD   No Known Allergies  FAMILY HISTORY:  indicated that his mother is deceased. He indicated that his father is deceased. He indicated that his sister is deceased. He indicated that his brother is deceased.  SOCIAL HISTORY:  reports that he has quit smoking. He has never used smokeless tobacco. He reports that he does not drink alcohol.  REVIEW OF SYSTEMS:  Unable to obtain  SUBJECTIVE:   VITAL SIGNS: Temp:  [95.8 F (35.4 C)-96.9 F (36.1 C)] 96.9 F (36.1 C) (08/27 0115) Pulse Rate:  [78-162] 162 (08/27 0259) Resp:  [15-43] 30 (08/27 0305) BP: (59-171)/(27-75) 113/75 mmHg (08/27 0305) SpO2:  [85 %-100 %] 90 % (08/27 0255) FiO2 (%):  [50 %-100 %] 50 % (08/27 0300) HEMODYNAMICS:   VENTILATOR SETTINGS: Vent Mode:  [-] PRVC FiO2 (%):  [50 %-100 %] 50 % Set Rate:  [18 bmp-30 bmp] 30 bmp Vt Set:  [580 mL] 580 mL PEEP:  [5 cmH20] 5 cmH20 INTAKE / OUTPUT:  Intake/Output Summary (Last 24 hours) at 03-08-15 0755 Last data filed at 03/03/15 2100  Gross per 24 hour  Intake 2038.89 ml  Output    200 ml  Net 1838.89 ml    PHYSICAL EXAMINATION: General:  Unarousable, thin, non-responsive Neuro:  Unresponsive GCS <3 HEENT:  NCAT, Pupils slowly reactive Cardiovascular:  Hypotensive, sinus , no m/r/g Lungs:  Diffuse rales b/ Abdomen:  Soft, non tender, non distended   LABS:  CBC  Recent Labs Lab 02/10/2015 1823 02/28/2015 1841 03/02/15 0251 03/03/15 0236  WBC 4.9  --  7.0 8.4  HGB 9.8* 10.2* 9.2* 9.1*  HCT 28.7* 30.0* 26.9* 27.0*  PLT 212  --  216 249   Coag's  Recent Labs Lab 02/15/2015 2005  APTT 51*  INR 1.68*    BMET  Recent Labs Lab 03/02/15 0257 03/02/15 0855 03/03/15 0236 03/03/15 1932  NA 140 141 135  --   K 3.9 3.8 3.3* 5.2*  CL 106 103 102  --   CO2 20* 22 20*  --   BUN 64* 58* 55*  --   CREATININE 2.71* 2.62* 2.46*  --   GLUCOSE 132* 195* 240*  --    Electrolytes  Recent Labs Lab 03/02/15 0257 03/02/15 0855 03/03/15 0236 03/03/15 1932  CALCIUM 7.8* 8.2* 7.7*  --   MG  --   --  1.6* 2.4   Sepsis Markers  Recent Labs Lab 03/02/15 1035 03/02/15 2125 03/02/15 2345  LATICACIDVEN 4.1* 3.2* 3.4*   ABG  Recent Labs Lab 03-08-2015 0220  PHART 7.359  PCO2ART 21.7*  PO2ART 243.0*   Liver Enzymes  Recent Labs  Lab 03/08/2015 1823 03/02/15 0257 03/03/15 0236  AST 2513* 1332* 1226*  ALT 3024* 1894* 2262*  ALKPHOS 118 106 118  BILITOT 0.5 0.3 0.4  ALBUMIN 2.9* 2.6* 2.5*   Cardiac Enzymes  Recent Labs Lab 03/02/15 0220 03/02/15 0855 03/02/15 1415  TROPONINI 23.39* 17.53* 15.24*   Glucose  Recent Labs Lab 03/03/15 1257 03/03/15 1631 03/03/15 2010 03/03/15 2334 03-10-2015 0043 03-10-2015 0120  GLUCAP 283* 253* 183* 94 88 86    Imaging Ct Head Wo Contrast  03/03/2015   CLINICAL DATA:  69 year old male with new onset seizures today.  EXAM: CT HEAD WITHOUT CONTRAST  TECHNIQUE: Contiguous axial images were obtained from the base of the skull through the vertex without intravenous contrast.  COMPARISON:  10/26/2014  FINDINGS: Atrophy, chronic small-vessel ischemic changes and remote pontine and bilateral thalamic infarcts noted.  No acute intracranial abnormalities are identified, including mass lesion or mass effect, hydrocephalus, extra-axial fluid collection, midline shift, hemorrhage, or acute infarction.  The visualized bony calvarium is unremarkable.  IMPRESSION: No evidence of acute intracranial abnormality.  Atrophy, chronic small-vessel white matter ischemic changes and remote pontine and thalamic infarcts.   Electronically Signed   By: Harmon Pier M.D.    On: 03/03/2015 13:37   Mr Brain Wo Contrast  03/03/2015   CLINICAL DATA:  Seizure  EXAM: MRI HEAD WITHOUT CONTRAST  TECHNIQUE: Multiplanar, multiecho pulse sequences of the brain and surrounding structures were obtained without intravenous contrast.  COMPARISON:  CT head 03/03/2015.  MRI 05/04/2010  FINDINGS: Negative for acute infarct.  Generalized atrophy. Ventricular enlargement right greater than left is unchanged from the prior study most consistent with atrophy.  Chronic infarcts in the thalamus bilaterally unchanged. Chronic infarcts in the pons bilaterally have progressed. Chronic microvascular ischemic change in the white matter is mild.  Negative for hemorrhage or mass.  Chronic occlusion left internal carotid artery unchanged.  IMPRESSION: Atrophy and chronic ischemic changes most prominent in the thalamus and pons. No acute infarct.   Electronically Signed   By: Marlan Palau M.D.   On: 03/03/2015 19:42   Portable Chest Xray  03/10/2015   CLINICAL DATA:  Endotracheal tube placement.  Shortness of breath.  EXAM: PORTABLE CHEST - 1 VIEW  COMPARISON:  02/15/2015  FINDINGS: Overlying monitoring device in the central chest, partially obscures evaluation. Endotracheal tube projects beyond the thoracic inlet, tentatively identified 4.1 cm from the carina. Enteric tube in place, tip below the diaphragm not included in the field of view. Left central line, tip in the mid SVC. No evidence pneumothorax. Patient is post median sternotomy. Heart at the upper limits normal in size. Bilateral pleural effusions, worsened on the left from prior. No pulmonary edema, mild vascular congestion.  IMPRESSION: 1. Endotracheal tube approximately 4.1 cm from the carina, detailed evaluation partially obscured by overlying artifact. Enteric tube in place. 2. Tip of the left central line in the mid SVC.  No pneumothorax. 3. Increasing left pleural effusion. Unchanged right pleural effusion.   Electronically Signed   By:  Rubye Oaks M.D.   On: 2015/03/10 03:01   Dg Abd Portable 1v  03/10/2015   CLINICAL DATA:  Encounter for feeding tube placement.  EXAM: PORTABLE ABDOMEN - 1 VIEW  COMPARISON:  None.  FINDINGS: Tip and side port of the enteric tube below the diaphragm in the stomach. There is air throughout normal caliber small and large bowel loops in the abdomen. No disproportionate bowel dilatation to suggest obstruction.  IMPRESSION: Tip and side  port of the enteric tube below the diaphragm in the stomach.   Electronically Signed   By: Rubye Oaks M.D.   On: 2015-03-17 04:10     ASSESSMENT / PLAN:  ICU course:  Upon arrival to the patients room he was non responsive and profoundly hypotensive.  His BG was 88 on finger stick and is ABG demonstrated a severe but compensated metabolic acidosis.  He was profoundly hypotensive.  A dopamine gtt was started and  epinephrine pushed IV, his pressure and HR improved, and he became more arrousable.  He then began to seize,  of IV ativan was pushed which broke the seizure, then the patient lost a pulse.  Code was called, the patient was intubated, and after of CPR/epinephrine/Bicarb for PEA arrest ROSC was achieved, he was taken to the ICU.  Upon arrival to the ICU he was persistently hypotensive, a CVC was placed, he was transitioning to norepinephrine when he became markedly bradycardic, his ABG was again compensated CPR was re-initiated with Epinephrine utilized as well as atropine and an epi gtt was ordered.  ROSC was achieved after 5 min. At this time family was taken into the room and GOC were discussed with the patients children who were in agreement that the patient would not want these heroic measures.    - the patient was made DNR/comfort and the end of life order set was activated at the request of the patients  family.  Total critical care time: 90 min  Critical care time was exclusive of separately billable procedures and treating other  patients.  Critical care was necessary to treat or prevent imminent or life-threatening deterioration.  Critical care was time spent personally by me on the following activities: development of treatment plan with patient and/or surrogate as well as nursing, discussions with consultants, evaluation of patient's response to treatment, examination of patient, obtaining history from patient or surrogate, ordering and performing treatments and interventions, ordering and review of laboratory studies, ordering and review of radiographic studies, pulse oximetry and re-evaluation of patient's condition.   Galvin Proffer, DO., MS [facility]    Pulmonary and Critical Care Medicine [facility] Pager: [phone]  2015/03/17, 7:55 AM

## 2015-03-09 NOTE — Progress Notes (Signed)
Patient arrived to unit following transfer after PEA arrest with code blue initiation on 2C. Patient intubated at this time. Dr. Donnamae Jude on unit. Central line placed with physician at approximately 145. 2 Amps sodium bicarb given to support patient's pressure. Sodium bicarb drip started as well. At 0250, patient HR to 30's. No femoral pulse. CPR initiated at 0250. Dr. Donnamae Jude was on unit at this time. See code blue sheet for meds. Pulse check at 0259 showed ROSC. Patient's family brought to bedside and spoke with MD. Made DNR with comfort measures. Patient compassionately extubated at [phone]. Time of death at 67 with family at bedside. Elink MD notified.

## 2015-03-09 NOTE — Progress Notes (Signed)
ANTIBIOTIC CONSULT NOTE - INITIAL  Pharmacy Consult for Vancocin and Zosyn Indication: rule out sepsis  No Known Allergies  Patient Measurements: Height: 5\' 10"  (177.8 cm) Weight: 153 lb 6.4 oz (69.582 kg) IBW/kg (Calculated) : 73  Vital Signs: Temp: 96.5 F (35.8 C) (08/26 2011) Temp Source: Axillary (08/26 2011) BP: 62/37 mmHg (08/26 2313) Pulse Rate: 95 (08/26 2313)  Labs:  Recent Labs  03/06/2015 1823 02/13/2015 1841 03/02/15 0251 03/02/15 0257 03/02/15 0855 03/03/15 0236  WBC 4.9  --  7.0  --   --  8.4  HGB 9.8* 10.2* 9.2*  --   --  9.1*  PLT 212  --  216  --   --  249  CREATININE 2.98* 2.80*  --  2.71* 2.62* 2.46*   Estimated Creatinine Clearance: 27.1 mL/min (by C-G formula based on Cr of 2.46).   Microbiology: Recent Results (from the past 720 hour(s))  MRSA PCR Screening     Status: Abnormal   Collection Time: 03/02/15  2:27 AM  Result Value Ref Range Status   MRSA by PCR POSITIVE (A) NEGATIVE Final    Comment:        The GeneXpert MRSA Assay (FDA approved for NASAL specimens only), is one component of a comprehensive MRSA colonization surveillance program. It is not intended to diagnose MRSA infection nor to guide or monitor treatment for MRSA infections. RESULT CALLED TO, READ BACK BY AND VERIFIED WITH: Judie Petit ENNIS 1610 @08 /25/16 MKELLY   Culture, blood (routine x 2)     Status: None (Preliminary result)   Collection Time: 03/02/15  9:25 PM  Result Value Ref Range Status   Specimen Description BLOOD RIGHT HAND  Final   Special Requests BOTTLES DRAWN AEROBIC AND ANAEROBIC 5CC  Final   Culture NO GROWTH < 24 HOURS  Final   Report Status PENDING  Incomplete  Culture, blood (routine x 2)     Status: None (Preliminary result)   Collection Time: 03/02/15  9:34 PM  Result Value Ref Range Status   Specimen Description BLOOD RIGHT HAND  Final   Special Requests IN PEDIATRIC BOTTLE 1CC  Final   Culture NO GROWTH < 24 HOURS  Final   Report Status PENDING   Incomplete    Medical History: Past Medical History  Diagnosis Date  . Ataxia, late effect of cerebrovascular disease   . Aphasia, late effect of cerebrovascular disease   . Cerebral thrombosis   . Stroke   . Coronary artery disease   . Hypertension   . Diabetes mellitus   . Vitamin D deficiency 06/19/2014  . 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 OA (osteoarthritis) of knee 12/31/2012  . Foot drop 12/31/2012  . BPH (benign prostatic hyperplasia) 06/19/2014    Medications:  Prescriptions prior to admission  Medication Sig Dispense Refill Last Dose  . acetaminophen (TYLENOL) 325 MG tablet Take 650 mg by mouth every 6 (six) hours as needed for mild pain, fever or headache.   02/27/2015 at Unknown time  . amLODipine (NORVASC) 5 MG tablet Take 5 mg by mouth daily.   02/15/2015 at Unknown time  . aspirin 81 MG tablet Take 81 mg by mouth daily.   02/19/2015 at Unknown time  . atorvastatin (LIPITOR) 10 MG tablet Take 10 mg by mouth daily.   02/28/2015 at Unknown time  . cilostazol (PLETAL) 100 MG tablet Take 100 mg by mouth daily.    02/26/2015 at Unknown time  . ciprofloxacin (  CIPRO) 250 MG tablet Take 250 mg by mouth 2 (two) times daily.   unknown at unknown  . clopidogrel (PLAVIX) 75 MG tablet Take 1 tablet by mouth Daily.   02/28/2015 at Unknown time  . docusate sodium (COLACE) 100 MG capsule Take 100 mg by mouth 2 (two) times daily.   03/08/2015 at Unknown time  . furosemide (LASIX) 40 MG tablet Take 40 mg by mouth daily.   08-Mar-2015 at Unknown time  . glipiZIDE (GLUCOTROL XL) 10 MG 24 hr tablet Take 10 mg by mouth daily with breakfast.   03-08-15 at Unknown time  . hydrALAZINE (APRESOLINE) 25 MG tablet Take 25 mg by mouth daily.    02/28/2015 at Unknown time  . insulin aspart (NOVOLOG FLEXPEN) 100 UNIT/ML FlexPen Inject 10 Units into the skin one time only at 6 PM. Only given one time only on 2015/03/08 per [facility]   2015/03/08 at Unknown time  . Insulin Detemir (LEVEMIR  FLEXPEN) 100 UNIT/ML Pen Inject 10 Units into the skin daily at 6 PM. For diabetes   02/28/2015 at Unknown time  . metFORMIN (GLUCOPHAGE) 500 MG tablet Take 1,250 mg by mouth 2 (two) times daily with a meal.    Past Week at Unknown time  . metoprolol (LOPRESSOR) 100 MG tablet Take 100 mg by mouth daily.   02/28/2015 at Unknown time  . Multiple Vitamins-Minerals (MULTIVITAMIN WITH MINERALS) tablet Take 1 tablet by mouth daily.   08-Mar-2015 at Unknown time  . ondansetron (ZOFRAN) 4 MG tablet Take 1 tablet (4 mg total) by mouth every 8 (eight) hours as needed for nausea or vomiting. 12 tablet 0 02/28/2015 at Unknown time  . oxybutynin (DITROPAN-XL) 5 MG 24 hr tablet Take 5 mg by mouth daily.    02/28/2015 at Unknown time  . pantoprazole (PROTONIX) 40 MG tablet Take 1 tablet (40 mg total) by mouth 2 (two) times daily. 120 tablet 11 2015/03/08 at Unknown time  . Polyethyl Glycol-Propyl Glycol (SYSTANE) 0.4-0.3 % SOLN Place 1 drop into both eyes every 12 (twelve) hours as needed. For dry eyes   unknown at unknown  . ramipril (ALTACE) 10 MG capsule TAKE ONE CAPSULE BY MOUTH TWICE DAILY 60 capsule 11 March 08, 2015 at Unknown time  . saccharomyces boulardii (FLORASTOR) 250 MG capsule Take 250 mg by mouth 2 (two) times daily.   03/08/2015 at Unknown time  . sennosides-docusate sodium (SENOKOT-S) 8.6-50 MG tablet Take 1 tablet by mouth daily as needed for constipation.   unknown at unknown  . sitaGLIPtin (JANUVIA) 100 MG tablet Take 100 mg by mouth daily.   03-08-15 at Unknown time  . tamsulosin (FLOMAX) 0.4 MG CAPS capsule Take 0.4 mg by mouth daily.    03-08-15 at Unknown time   Scheduled:  . antiseptic oral rinse  7 mL Mouth Rinse QID  . aspirin EC  81 mg Oral Daily  . atorvastatin  10 mg Oral Daily  . chlorhexidine gluconate  15 mL Mouth Rinse BID  . Chlorhexidine Gluconate Cloth  6 each Topical Q0600  . cilostazol  100 mg Oral Daily  . clopidogrel  75 mg Oral Daily  . docusate sodium  100 mg Oral BID  .  famotidine (PEPCID) IV  20 mg Intravenous Q12H  . fentaNYL      . folic acid  1 mg Intravenous Daily  . gelatin adsorbable  1 each Topical Once  . insulin aspart  0-20 Units Subcutaneous 6 times per day  . insulin detemir  10 Units  Subcutaneous Daily  . lacosamide (VIMPAT) IV  200 mg Intravenous Once  . levETIRAcetam  500 mg Intravenous Q12H  . midazolam      . midazolam      . midazolam  4 mg Intravenous Once  . mupirocin ointment  1 application Nasal BID  . oxybutynin  5 mg Oral Daily  . piperacillin-tazobactam  3.375 g Intravenous STAT  . piperacillin-tazobactam (ZOSYN)  IV  3.375 g Intravenous Q8H  . saccharomyces boulardii  250 mg Oral BID  . sodium bicarbonate      . sodium bicarbonate      . sodium bicarbonate  50 mEq Intravenous Once  . sodium bicarbonate  50 mEq Intravenous Once  . tamsulosin  0.4 mg Oral Daily  . thiamine  100 mg Intravenous Daily  . vancomycin  1,000 mg Intravenous STAT  . [START ON 03/05/2015] vancomycin  750 mg Intravenous Q24H   Infusions:  . sodium chloride 100 mL/hr at 03/03/15 1811  . heparin 1,300 Units/hr (03/03/15 1300)  . norepinephrine (LEVOPHED) Adult infusion    .  sodium bicarbonate 150 mEq in sterile water 1000 mL infusion      Assessment: 70yo male admitted 8/24 w/ cardiogenic shock, yesterday am had sz, overnight pt was hypotensive w/ sz activity, pt subsequently went into PEA arrest, ROSC w/ code team, now tx'd to ICU, to begin IV ABX for possible sepsis.  Goal of Therapy:  Vancomycin trough level 15-20 mcg/ml  Plan:  Will give vancomycin 1000mg  and Zosyn 3.375g IV stat followed by vanc 750mg  IV Q24H and Zosyn 3.375g IV Q8H and monitor CBC, Cx, levels prn.  Vernard Gambles, PharmD, BCPS  03/02/2015,2:30 AM

## 2015-03-09 NOTE — Progress Notes (Signed)
  Patient Name: Roger Berger   MRN: [id number]   Date of Birth/ Sex: 07-07-1946 , male      Admission Date: 02/19/2015  Attending Provider: Drema Dallas, MD  Primary Diagnosis: Cardiogenic shock   Indication: Pt was in his usual state of health until this PM, when he was noted to have worsening mental status, hypotension, and new-onset seizure activity. Patient was subsequently found to be in PEA arrest. Code blue was subsequently called. At the time of arrival on scene, ACLS protocol was underway.   Technical Description:  - CPR performance duration:  5 minutes     - Was defibrillation or cardioversion used? No   - Was external pacer placed? No  - Was patient intubated pre/post CPR? Yes   Medications Administered: Y = Yes; Blank = No Amiodarone    Atropine    Calcium    Epinephrine  2  Lidocaine    Magnesium    Norepinephrine    Phenylephrine    Sodium bicarbonate    Vasopressin     Dopamine drip started  Post CPR evaluation:  - Final Status - Was patient successfully resuscitated ? Yes - What is current rhythm? Sinus tachycardia - What is current hemodynamic status? Mildly hypotensive on dopamine drip  Miscellaneous Information:  - Labs sent, including: None  - Primary team notified?  Yes  - Family Notified? Yes  - Additional notes/ transfer status: PCCM at bedside. Patient transferred to ICU for further management/vent support     Darrick Huntsman, MD  30-Mar-2015, 1:03 AM

## 2015-03-09 NOTE — Significant Event (Addendum)
Rapid Response Event Note Called by primary RN to see pt for hypotension  Overview: Time Called: 2323 Arrival Time: 2325 Event Type: Neurologic, Cardiac  Initial Focused Assessment:  On arrival to the room, SBP 70s.  Pt with Rt gaze deviation and Rt thumb twitching, unresponsive.  HR 80s, RR 30s. Pt actively receiving a NS bolus and SBP 116.  Discussed with Daphane Shepherd, NP that I felt pt was having seizure activity and recommended getting a neurology consult as one has not been obtained to this point. Ativan  given and seizure activity ceased.  Pt found to have Kussmaul resp.  Dr. Hosie Poisson, neuro, at bedside. Called away from the bedside, but left primary care team at bedside.  Pt has been altered since this am, around 0900, when he became unresponsive.  He had witnessed seizures this am as well.   Interventions:   Event Summary: Name of Physician Notified: M. Burnadette Peter, NP at [phone]  Name of Consulting Physician Notified: Dr. Hosie Poisson at          [facility], Roger Berger

## 2015-03-09 NOTE — Consult Note (Signed)
Consult Reason for Consult: seizure activity Referring Physician: Dr Joseph Art  CC: new onset seizure  HPI: Roger Berger is an 69 y.o. male hx of CAD s/p CABG in 1999 sent from [facility] on 8/24 with generalized weakness for a few days. In ED found to have AKI, shock liver and a NSTEMI with troponin of 20. Was admitted with working diagnosis of cardiogenic shock, AKI, NSTEMI and UTI. Mental status was stable until morning of 8/26 when he was noted to have tonic movements with left gaze deviation lasting around 2 minutes. Had subsequent similar event shortly after. Loaded with keppra  x 1. Brief episodes continued throughout the day.  EEG consistent with an encephalopathy, no noted seizure activity. MRI brain completed, imaging reviewed and overall unremarkable.   Neurology consulted on 8/27 for question of further seizure activity described as brief episode of right gaze deviation and right thumb twitching lasting < 1 minute. Given ativan for this. This is in the setting of hypotension and continued minimal responsiveness. Per his RN he has been like this all day, though the hypotension is new.   Labs pertinent for BUN 55, Cr 2.46, calcium 8.2, lactic acid 3.4. A TTE shows EF of 10 to 15%. He received a total of 4 doses of  ativan throughout the day.   Past Medical History  Diagnosis Date  . Ataxia, late effect of cerebrovascular disease   . Aphasia, late effect of cerebrovascular disease   . Cerebral thrombosis   . Stroke   . Coronary artery disease   . Hypertension   . Diabetes mellitus   . Vitamin D deficiency 06/19/2014  . 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 OA (osteoarthritis) of knee 12/31/2012  . Foot drop 12/31/2012  . BPH (benign prostatic hyperplasia) 06/19/2014    Past Surgical History  Procedure Laterality Date  . Coronary artery bypass graft      Family History  Problem Relation Age of Onset  . Heart disease Father     Social  History:  reports that he has quit smoking. He has never used smokeless tobacco. He reports that he does not drink alcohol. His drug history is not on file.  No Known Allergies  Medications: I have reviewed the patient's current medications.   ROS: Out of a complete 14 system review, the patient complains of only the following symptoms, and all other reviewed systems are negative. +seizure  Physical Examination: Filed Vitals:   03/03/15 2313  BP: 62/37  Pulse: 95  Temp:   Resp: 35   Physical Exam  Constitutional: He appears well-developed and well-nourished.  Psych: Affect appropriate to situation Eyes: No scleral injection HENT: No OP obstrucion Head: Normocephalic.  Cardiovascular: Normal rate and regular rhythm.  Respiratory: rapid deep breathing with pauses GI: Soft. Bowel sounds are normal. No distension. There is no tenderness.  Skin: WDI  Neurologic Examination (recieved ativan prior to my exam) Mental Status: Eyes closed, will briefly open to noxious stimuli. Non-verbal, not following commands Cranial Nerves: II: unable to visualize optic discs, PEERL III,IV, VI: ptosis not present, eyes midline, no gaze deviation, + dolls eyes V,VII: face symmetric, + corneal reflex IX,X: gag reflex present Motor: No spontaneous movements, no seizure activity noted Tone and bulk:normal tone throughout; no atrophy noted Sensory: grimaces and weakly withdrawals to noxious stimuli in all 4 extremities Deep Tendon Reflexes: 1+ and symmetric throughout Plantars: Right: downgoing   Left: downgoing Cerebellar: Unable to test Gait: unable  to test  Laboratory Studies:   Basic Metabolic Panel:  Recent Labs Lab 02/15/2015 1823 03/06/2015 1841 03/02/15 0257 03/02/15 0855 03/03/15 0236 03/03/15 1932  NA 136 136 140 141 135  --   K 4.9 4.8 3.9 3.8 3.3* 5.2*  CL 98* 101 106 103 102  --   CO2 19*  --  20* 22 20*  --   GLUCOSE 362* 362* 132* 195* 240*  --   BUN 62* 56* 64* 58*  55*  --   CREATININE 2.98* 2.80* 2.71* 2.62* 2.46*  --   CALCIUM 8.4*  --  7.8* 8.2* 7.7*  --   MG  --   --   --   --  1.6* 2.4    Liver Function Tests:  Recent Labs Lab 02/28/2015 1823 03/02/15 0257 03/03/15 0236  AST 2513* 1332* 1226*  ALT 3024* 1894* 2262*  ALKPHOS 118 106 118  BILITOT 0.5 0.3 0.4  PROT 6.8 6.1* 6.0*  ALBUMIN 2.9* 2.6* 2.5*    Recent Labs Lab 02/20/2015 2005  LIPASE 67*   No results for input(s): AMMONIA in the last 168 hours.  CBC:  Recent Labs Lab 02/28/2015 1823 02/21/2015 1841 03/02/15 0251 03/03/15 0236  WBC 4.9  --  7.0 8.4  NEUTROABS 2.9  --   --   --   HGB 9.8* 10.2* 9.2* 9.1*  HCT 28.7* 30.0* 26.9* 27.0*  MCV 93.2  --  94.7 92.8  PLT 212  --  216 249    Cardiac Enzymes:  Recent Labs Lab 02/16/2015 1823 03/02/15 0220 03/02/15 0855 03/02/15 1415  TROPONINI 19.39* 23.39* 17.53* 15.24*    BNP: Invalid input(s): POCBNP  CBG:  Recent Labs Lab 03/03/15 0934 03/03/15 1257 03/03/15 1631 03/03/15 2010 03/03/15 2334  GLUCAP 352* 283* 253* 183* 94    Microbiology: Results for orders placed or performed during the hospital encounter of 02/28/2015  MRSA PCR Screening     Status: Abnormal   Collection Time: 03/02/15  2:27 AM  Result Value Ref Range Status   MRSA by PCR POSITIVE (A) NEGATIVE Final    Comment:        The GeneXpert MRSA Assay (FDA approved for NASAL specimens only), is one component of a comprehensive MRSA colonization surveillance program. It is not intended to diagnose MRSA infection nor to guide or monitor treatment for MRSA infections. RESULT CALLED TO, READ BACK BY AND VERIFIED WITH: M ENNIS [id number] @08 /25/16 MKELLY   Culture, blood (routine x 2)     Status: None (Preliminary result)   Collection Time: 03/02/15  9:25 PM  Result Value Ref Range Status   Specimen Description BLOOD RIGHT HAND  Final   Special Requests BOTTLES DRAWN AEROBIC AND ANAEROBIC 5CC  Final   Culture NO GROWTH < 24 HOURS  Final    Report Status PENDING  Incomplete  Culture, blood (routine x 2)     Status: None (Preliminary result)   Collection Time: 03/02/15  9:34 PM  Result Value Ref Range Status   Specimen Description BLOOD RIGHT HAND  Final   Special Requests IN PEDIATRIC BOTTLE 1CC  Final   Culture NO GROWTH < 24 HOURS  Final   Report Status PENDING  Incomplete    Coagulation Studies:  Recent Labs  02/10/2015 2005  LABPROT 19.8*  INR 1.68*    Urinalysis:  Recent Labs Lab 02/20/2015 1724  COLORURINE AMBER*  LABSPEC 1.015  PHURINE 5.0  GLUCOSEU 100*  HGBUR MODERATE*  BILIRUBINUR NEGATIVE  KETONESUR 15*  PROTEINUR NEGATIVE  UROBILINOGEN 1.0  NITRITE NEGATIVE  LEUKOCYTESUR LARGE*    Lipid Panel:     Component Value Date/Time   CHOL 123 09/09/2014   TRIG 79 09/09/2014   HDL 36 09/09/2014   CHOLHDL 3.1 05/06/2010 0558   VLDL 18 05/06/2010 0558   LDLCALC 72 09/09/2014    HgbA1C:  Lab Results  Component Value Date   HGBA1C 5.9 09/09/2014    Urine Drug Screen:     Component Value Date/Time   LABOPIA NONE DETECTED 10/26/2014 2155   COCAINSCRNUR NONE DETECTED 10/26/2014 2155   LABBENZ NONE DETECTED 10/26/2014 2155   AMPHETMU NONE DETECTED 10/26/2014 2155   THCU NONE DETECTED 10/26/2014 2155   LABBARB NONE DETECTED 10/26/2014 2155    Alcohol Level: No results for input(s): ETH in the last 168 hours.  Other results:  Imaging: Ct Head Wo Contrast  03/03/2015   CLINICAL DATA:  69 year old male with new onset seizures today.  EXAM: CT HEAD WITHOUT CONTRAST  TECHNIQUE: Contiguous axial images were obtained from the base of the skull through the vertex without intravenous contrast.  COMPARISON:  10/26/2014  FINDINGS: Atrophy, chronic small-vessel ischemic changes and remote pontine and bilateral thalamic infarcts noted.  No acute intracranial abnormalities are identified, including mass lesion or mass effect, hydrocephalus, extra-axial fluid collection, midline shift, hemorrhage, or acute  infarction.  The visualized bony calvarium is unremarkable.  IMPRESSION: No evidence of acute intracranial abnormality.  Atrophy, chronic small-vessel white matter ischemic changes and remote pontine and thalamic infarcts.   Electronically Signed   By: Harmon Pier M.D.   On: 03/03/2015 13:37   Mr Brain Wo Contrast  03/03/2015   CLINICAL DATA:  Seizure  EXAM: MRI HEAD WITHOUT CONTRAST  TECHNIQUE: Multiplanar, multiecho pulse sequences of the brain and surrounding structures were obtained without intravenous contrast.  COMPARISON:  CT head 03/03/2015.  MRI 05/04/2010  FINDINGS: Negative for acute infarct.  Generalized atrophy. Ventricular enlargement right greater than left is unchanged from the prior study most consistent with atrophy.  Chronic infarcts in the thalamus bilaterally unchanged. Chronic infarcts in the pons bilaterally have progressed. Chronic microvascular ischemic change in the white matter is mild.  Negative for hemorrhage or mass.  Chronic occlusion left internal carotid artery unchanged.  IMPRESSION: Atrophy and chronic ischemic changes most prominent in the thalamus and pons. No acute infarct.   Electronically Signed   By: Marlan Palau M.D.   On: 03/03/2015 19:42     Assessment/Plan:  69y/o hx of CAD admitted with suspected cardiogenic shock, AKI, shock liver and UTI now with new onset of seizure in the setting of hypotension and depressed mental status. MRI brain unremarkable and EEG earlier showed no seizure activity. Patient continues to have questionable breakthrough seizures though based on exam and test results he does not appear to be in NCSE. Suspect current hypotension and respiratory change related to underlying cardiogenic shock   -continue keppra -loaded with vimpat, continue vimpat 50mg  BID -may consider repeat EEG in the morning if mental status has not improved -CCM to evaluate for potential transfer to ICU based on hypotension and respiratory changes. May need to  discuss goals of care with family -will continue to follow  Elspeth Cho, DO [facility] [phone]  If 7pm- 7am, please page neurology on call as listed in AMION. 02/23/2015, 12:33 AM

## 2015-03-09 NOTE — Procedures (Signed)
INTUBATION PROCEDURE NOTE  Indication: Cardiac arrest Consent: Emergent verbal (by son) Time Out: no Medications: none Paralytic/RSI: no Technique: DL Blade: MAC 3 then Miller 3 Cords Visualized: yes View: Grade 4 with MAC 3.  Grade 1-2 with Hyacinth Meeker 3 # of attempts: 2 Tube confirmation:   Chest rise: yes  Bilateral Breath Sounds: yes  Color change on CO2 detector: yes  ETCO2: yes  CXR: ETT in position Successful placement: yes    Galvin Proffer, DO., MS [facility] Pulmonary/Critical Care

## 2015-03-09 NOTE — Progress Notes (Signed)
Pt become hypotensive. RN paged MD and rapid response. Rapid response arrived, assessed pt. MD Elray Mcgregor arrived and PCCM was called. PCCM called a code blue. Pt became pulseless, dopamine gtt started,  epi pushed, compressions started. Pt intubated and bagged on the unit. Transferred to 3M11.

## 2015-03-09 NOTE — Plan of Care (Signed)
ICU PLAN OF CARE  Spoke with family after 2nd PEA arrest.  Thus far in the work up no obvious reversible causes for arrest.  Given this was the second code in 5 hours, family would like to discontinue artificial support and proceed with comfort measures stating: " my dad would not want any of this."  DNR/Comfort  End of life order set activated.  Compassionate extubation.  Galvin Proffer, DO., MS [place] [facility]

## 2015-03-09 DEATH — deceased

## 2015-03-15 ENCOUNTER — Encounter: Payer: Self-pay | Admitting: Internal Medicine

## 2015-04-08 NOTE — Discharge Summary (Signed)
Physician Discharge Summary  Roger Berger:[id number] DOB: 10/27/1945 DOA: 02/26/2015  PCP: Kirt Boys, DO  Admit date: 02/15/2015 Discharge date: 03/02/2015 Time spent: 30 minutes  Recommendations for Outpatient Follow-up:  1. None  Discharge Diagnoses:  Principal Problem:   Cardiogenic shock Active Problems:   S/P CABG x 5   DM (diabetes mellitus) type II uncontrolled with eye manifestation   Acute kidney failure   NSTEMI (non-ST elevated myocardial infarction)   Shock liver   UTI (lower urinary tract infection)   Essential hypertension   Acute renal failure syndrome   Lactic acidosis   Diabetes type 2, controlled   Acute cystitis without hematuria   Seizure   Acute systolic CHF (congestive heart failure)   Pulmonary hypertension   Hypokalemia   Hypomagnesemia   Seizures   Discharge Condition: Deceased   Diet recommendation: N/A  Filed Weights   03/06/2015 1714 03/02/15 0125  Weight: 71.668 kg (158 lb) 69.582 kg (153 lb 6.4 oz)    History of present illness:  69 yo male with MMP including severe HFrEF, COPD who had presented with abdominal pain and fatigue. During his admission he had new seizure activity and was seen by neurology. He underwent MRI and EEG which were unremarkable. Yesterday he developed progressive hypotension and somnolence. PCCM was consulted for evaluation.  Hospital Course:  Upon arrival to the patients room he was non responsive and profoundly hypotensive. His ABG demonstrated a severe but compensated metabolic acidosis with shock.  A dopamine gtt was started and 1mg  epinephrine pushed IV, his pressure and HR improved, and he became more arrousable. He then seized,  ativan was administered which broke the seizure, then the patient then suffered a PEA arrest. Code was called, the patient was intubated, and after of CPR/epinephrine/Bicarb for PEA arrest ROSC was achieved, he was taken to the ICU.A CVC was placed, he was  transitioning to norepinephrine when he became markedly bradycardic, his ABG was again compensated CPR was re-initiated with Epinephrine utilized as well as atropine and an epi gtt was ordered. ROSC was achieved after 5 min. At this time  GOC were discussed with the patients children who were in agreement that the patient would not want these heroic measures. Patient was made DNR/comfort and the end of life order set was activated at the request of the patients family.  Procedures:  Endotracheal intubation.  Left IJ CVC   Consultations:  None  Discharge Exam:  Deceased  Filed Vitals:   Mar 23, 2015 0305  BP: 113/75  Pulse:   Temp:   Resp: 30     Discharge Instructions none   Discharge Medication List as of 03/23/15  5:29 AM    CONTINUE these medications which have NOT CHANGED   Details  acetaminophen (TYLENOL) 325 MG tablet Take 650 mg by mouth every 6 (six) hours as needed for mild pain, fever or headache., Until Discontinued, Historical Med    amLODipine (NORVASC) 5 MG tablet Take 5 mg by mouth daily., Until Discontinued, Historical Med    aspirin 81 MG tablet Take 81 mg by mouth daily., Until Discontinued, Historical Med    atorvastatin (LIPITOR) 10 MG tablet Take 10 mg by mouth daily., Until Discontinued, Historical Med    cilostazol (PLETAL) 100 MG tablet Take 100 mg by mouth daily. , Until Discontinued, Historical Med    ciprofloxacin (CIPRO) 250 MG tablet Take 250 mg by mouth 2 (two) times daily., Until Discontinued, Historical Med    clopidogrel (PLAVIX) 75 MG  tablet Take 1 tablet by mouth Daily., Starting 05/25/2012, Until Discontinued, Historical Med    docusate sodium (COLACE) 100 MG capsule Take 100 mg by mouth 2 (two) times daily., Until Discontinued, Historical Med    furosemide (LASIX) 40 MG tablet Take 40 mg by mouth daily., Until Discontinued, Historical Med    glipiZIDE (GLUCOTROL XL) 10 MG 24 hr tablet Take 10 mg by mouth daily with breakfast., Until  Discontinued, Historical Med    hydrALAZINE (APRESOLINE) 25 MG tablet Take 25 mg by mouth daily. , Until Discontinued, Historical Med    insulin aspart (NOVOLOG FLEXPEN) 100 UNIT/ML FlexPen Inject 10 Units into the skin one time only at 6 PM. Only given one time only on 02/15/2015 per MAR, Until Discontinued, Historical Med    Insulin Detemir (LEVEMIR FLEXPEN) 100 UNIT/ML Pen Inject 10 Units into the skin daily at 6 PM. For diabetes, Until Discontinued, Historical Med    metFORMIN (GLUCOPHAGE) 500 MG tablet Take 1,250 mg by mouth 2 (two) times daily with a meal. , Until Discontinued, Historical Med    metoprolol (LOPRESSOR) 100 MG tablet Take 100 mg by mouth daily., Until Discontinued, Historical Med    Multiple Vitamins-Minerals (MULTIVITAMIN WITH MINERALS) tablet Take 1 tablet by mouth daily., Until Discontinued, Historical Med    ondansetron (ZOFRAN) 4 MG tablet Take 1 tablet (4 mg total) by mouth every 8 (eight) hours as needed for nausea or vomiting., Starting 10/26/2014, Until Discontinued, Print    oxybutynin (DITROPAN-XL) 5 MG 24 hr tablet Take 5 mg by mouth daily. , Starting 04/07/2013, Until Discontinued, Historical Med    pantoprazole (PROTONIX) 40 MG tablet Take 1 tablet (40 mg total) by mouth 2 (two) times daily., Starting 11/16/2014, Until Discontinued, No Print    Polyethyl Glycol-Propyl Glycol (SYSTANE) 0.4-0.3 % SOLN Place 1 drop into both eyes every 12 (twelve) hours as needed. For dry eyes, Until Discontinued, Historical Med    ramipril (ALTACE) 10 MG capsule TAKE ONE CAPSULE BY MOUTH TWICE DAILY, Normal    saccharomyces boulardii (FLORASTOR) 250 MG capsule Take 250 mg by mouth 2 (two) times daily., Until Discontinued, Historical Med    sennosides-docusate sodium (SENOKOT-S) 8.6-50 MG tablet Take 1 tablet by mouth daily as needed for constipation., Until Discontinued, Historical Med    sitaGLIPtin (JANUVIA) 100 MG tablet Take 100 mg by mouth daily., Until Discontinued,  Historical Med    tamsulosin (FLOMAX) 0.4 MG CAPS capsule Take 0.4 mg by mouth daily. , Starting 04/07/2013, Until Discontinued, Historical Med       No Known Allergies    The results of significant diagnostics from this hospitalization (including imaging, microbiology, ancillary and laboratory) are listed below for reference.    Significant Diagnostic Studies: Ct Head Wo Contrast  03/03/2015   CLINICAL DATA:  69 year old male with new onset seizures today.  EXAM: CT HEAD WITHOUT CONTRAST  TECHNIQUE: Contiguous axial images were obtained from the base of the skull through the vertex without intravenous contrast.  COMPARISON:  10/26/2014  FINDINGS: Atrophy, chronic small-vessel ischemic changes and remote pontine and bilateral thalamic infarcts noted.  No acute intracranial abnormalities are identified, including mass lesion or mass effect, hydrocephalus, extra-axial fluid collection, midline shift, hemorrhage, or acute infarction.  The visualized bony calvarium is unremarkable.  IMPRESSION: No evidence of acute intracranial abnormality.  Atrophy, chronic small-vessel white matter ischemic changes and remote pontine and thalamic infarcts.   Electronically Signed   By: Harmon Pier M.D.   On: 03/03/2015 13:37   Mr  Brain Wo Contrast  03/03/2015   CLINICAL DATA:  Seizure  EXAM: MRI HEAD WITHOUT CONTRAST  TECHNIQUE: Multiplanar, multiecho pulse sequences of the brain and surrounding structures were obtained without intravenous contrast.  COMPARISON:  CT head 03/03/2015.  MRI 05/04/2010  FINDINGS: Negative for acute infarct.  Generalized atrophy. Ventricular enlargement right greater than left is unchanged from the prior study most consistent with atrophy.  Chronic infarcts in the thalamus bilaterally unchanged. Chronic infarcts in the pons bilaterally have progressed. Chronic microvascular ischemic change in the white matter is mild.  Negative for hemorrhage or mass.  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  IMPRESSION: Atrophy and chronic ischemic changes most prominent in the thalamus and pons. No acute infarct.   Electronically Signed   By: Marlan Palau M.D.   On: 03/03/2015 19:42   Portable Chest Xray  12-Mar-2015   CLINICAL DATA:  Endotracheal tube placement.  Shortness of breath.  EXAM: PORTABLE CHEST - 1 VIEW  COMPARISON:  02/27/2015  FINDINGS: Overlying monitoring device in the central chest, partially obscures evaluation. Endotracheal tube projects beyond the thoracic inlet, tentatively identified 4.1 cm from the carina. Enteric tube in place, tip below the diaphragm not included in the field of view. Left central line, tip in the mid SVC. No evidence pneumothorax. Patient is post median sternotomy. Heart at the upper limits normal in size. Bilateral pleural effusions, worsened on the left from prior. No pulmonary edema, mild vascular congestion.  IMPRESSION: 1. Endotracheal tube approximately 4.1 cm from the carina, detailed evaluation partially obscured by overlying artifact. Enteric tube in place. 2. Tip of the left central line in the mid SVC.  No pneumothorax. 3. Increasing left pleural effusion. Unchanged right pleural effusion.   Electronically Signed   By: Rubye Oaks M.D.   On: March 12, 2015 03:01   Dg Chest Portable 1 View  03/03/2015   CLINICAL DATA:  Short of breath, cough, diabetic  EXAM: PORTABLE CHEST - 1 VIEW  COMPARISON:  07/03/2013  FINDINGS: Sternotomy wires overlies normal cardiac silhouette. There are bilateral small effusions increased from prior. No pulmonary edema or pneumothorax. No focal infiltrate.  IMPRESSION: New bibasilar effusions.   Electronically Signed   By: Genevive Bi M.D.   On: 02/25/2015 17:43   Dg Abd Portable 1v  03/12/15   CLINICAL DATA:  Encounter for feeding tube placement.  EXAM: PORTABLE ABDOMEN - 1 VIEW  COMPARISON:  None.  FINDINGS: Tip and side port of the enteric tube below the diaphragm in the stomach. There is air  throughout normal caliber small and large bowel loops in the abdomen. No disproportionate bowel dilatation to suggest obstruction.  IMPRESSION: Tip and side port of the enteric tube below the diaphragm in the stomach.   Electronically Signed   By: Rubye Oaks M.D.   On: 2015/03/12 04:10   US Abdomen Limited Ruq  02/20/2015   CLINICAL DATA:  Transaminitis.  Vomiting.  EXAM: US ABDOMEN LIMITED - RIGHT UPPER QUADRANT  COMPARISON:  CT abdomen 11/06/2005  FINDINGS: Gallbladder:  Minimally distended with diffuse gallbladder wall thickening, wall measurement of 4-6 mm. In 10 mm stone is seen in the gallbladder lumen. Probable adjacent pericholecystic edema. No sonographic Murphy sign noted.  Common bile duct:  Diameter: 3.3 mm, normal.  Liver:  No focal lesion identified. Within normal limits in parenchymal echogenicity. Normal directional flow in the main portal vein.  Other: Right pleural effusion visualized.  IMPRESSION: 1. Cholelithiasis with diffuse gallbladder wall thickening and probable  adjacent pericholecystic edema. No sonographic Murphy sign. Findings may reflect acute versus chronic cholecystitis. No biliary dilatation. Nuclear medicine hepatobiliary scan could be considered for further evaluation based on clinical concern. 2. Right pleural effusion.   Electronically Signed   By: Rubye Oaks M.D.   On: 03/05/2015 21:29    Microbiology: Recent Results (from the past 240 hour(s))  MRSA PCR Screening     Status: Abnormal   Collection Time: 03/02/15  2:27 AM  Result Value Ref Range Status   MRSA by PCR POSITIVE (A) NEGATIVE Final    Comment:        The GeneXpert MRSA Assay (FDA approved for NASAL specimens only), is one component of a comprehensive MRSA colonization surveillance program. It is not intended to diagnose MRSA infection nor to guide or monitor treatment for MRSA infections. RESULT CALLED TO, READ BACK BY AND VERIFIED WITH: M ENNIS 1610 @08 /25/16 MKELLY   Culture,  blood (routine x 2)     Status: None   Collection Time: 03/02/15  9:25 PM  Result Value Ref Range Status   Specimen Description BLOOD RIGHT HAND  Final   Special Requests BOTTLES DRAWN AEROBIC AND ANAEROBIC 5CC  Final   Culture NO GROWTH 5 DAYS  Final   Report Status 03/07/2015 FINAL  Final  Culture, blood (routine x 2)     Status: None   Collection Time: 03/02/15  9:34 PM  Result Value Ref Range Status   Specimen Description BLOOD RIGHT HAND  Final   Special Requests IN PEDIATRIC BOTTLE 1CC  Final   Culture NO GROWTH 5 DAYS  Final   Report Status 03/07/2015 FINAL  Final  Culture, Urine     Status: None   Collection Time: 03/03/15 10:00 AM  Result Value Ref Range Status   Specimen Description URINE, CLEAN CATCH  Final   Special Requests NONE  Final   Culture NO GROWTH 1 DAY  Final   Report Status 2015/03/13 FINAL  Final     Labs: Basic Metabolic Panel:  Recent Labs Lab 03/03/15 1932  K 5.2*  MG 2.4   Liver Function Tests: No results for input(s): AST, ALT, ALKPHOS, BILITOT, PROT, ALBUMIN in the last 168 hours. No results for input(s): LIPASE, AMYLASE in the last 168 hours. No results for input(s): AMMONIA in the last 168 hours. CBC: No results for input(s): WBC, NEUTROABS, HGB, HCT, MCV, PLT in the last 168 hours. Cardiac Enzymes: No results for input(s): CKTOTAL, CKMB, CKMBINDEX, TROPONINI in the last 168 hours. BNP: BNP (last 3 results) No results for input(s): BNP in the last 8760 hours.  ProBNP (last 3 results) No results for input(s): PROBNP in the last 8760 hours.  CBG:  Recent Labs Lab 03/03/15 2010 03/03/15 2334 March 13, 2015 0043 Mar 13, 2015 0120  GLUCAP 183* [phone]       Signed:  Darius Bump [facility] 03/10/2015, 6:47 PM
